# Patient Record
Sex: Female | Born: 1975 | Race: Black or African American | Hispanic: No | Marital: Single | Smoking: Never smoker
Health system: Southern US, Community
[De-identification: ages and names within clinical notes are randomized; demographics above are authoritative.]

## PROBLEM LIST (undated history)

## (undated) DIAGNOSIS — I10 Essential (primary) hypertension: Secondary | ICD-10-CM

---

## 2009-08-19 ENCOUNTER — Emergency Department (HOSPITAL_COMMUNITY): Admission: EM | Admit: 2009-08-19 | Discharge: 2009-08-19 | Payer: Self-pay | Admitting: Emergency Medicine

## 2009-09-07 ENCOUNTER — Emergency Department (HOSPITAL_COMMUNITY): Admission: EM | Admit: 2009-09-07 | Discharge: 2009-09-07 | Payer: Self-pay | Admitting: Emergency Medicine

## 2010-08-17 LAB — POCT I-STAT, CHEM 8
Calcium, Ion: 1.18 mmol/L (ref 1.12–1.32)
Chloride: 106 mEq/L (ref 96–112)
Creatinine, Ser: 0.8 mg/dL (ref 0.4–1.2)
HCT: 44 % (ref 36.0–46.0)
TCO2: 25 mmol/L (ref 0–100)

## 2010-08-17 LAB — URINALYSIS, ROUTINE W REFLEX MICROSCOPIC
Glucose, UA: NEGATIVE mg/dL
Ketones, ur: NEGATIVE mg/dL
Protein, ur: 30 mg/dL — AB
Specific Gravity, Urine: 1.024 (ref 1.005–1.030)
Urobilinogen, UA: 1 mg/dL (ref 0.0–1.0)

## 2010-08-17 LAB — URINE CULTURE

## 2010-08-17 LAB — URINE MICROSCOPIC-ADD ON

## 2013-08-14 ENCOUNTER — Emergency Department (HOSPITAL_COMMUNITY)
Admission: EM | Admit: 2013-08-14 | Discharge: 2013-08-15 | Disposition: A | Discharge status: Home / Self Care | Payer: Medicaid Other | Attending: Emergency Medicine | Admitting: Emergency Medicine

## 2013-08-14 ENCOUNTER — Encounter (HOSPITAL_COMMUNITY): Payer: Self-pay | Admitting: Emergency Medicine

## 2013-08-14 DIAGNOSIS — Z3202 Encounter for pregnancy test, result negative: Secondary | ICD-10-CM | POA: Insufficient documentation

## 2013-08-14 DIAGNOSIS — N390 Urinary tract infection, site not specified: Secondary | ICD-10-CM | POA: Insufficient documentation

## 2013-08-14 DIAGNOSIS — I10 Essential (primary) hypertension: Secondary | ICD-10-CM | POA: Insufficient documentation

## 2013-08-14 DIAGNOSIS — J069 Acute upper respiratory infection, unspecified: Secondary | ICD-10-CM

## 2013-08-14 DIAGNOSIS — Z79899 Other long term (current) drug therapy: Secondary | ICD-10-CM | POA: Insufficient documentation

## 2013-08-14 HISTORY — DX: Essential (primary) hypertension: I10

## 2013-08-14 LAB — I-STAT TROPONIN, ED: Troponin i, poc: 0 ng/mL (ref 0.00–0.08)

## 2013-08-14 LAB — COMPREHENSIVE METABOLIC PANEL
ALK PHOS: 53 U/L (ref 39–117)
ALT: 18 U/L (ref 0–35)
AST: 19 U/L (ref 0–37)
Albumin: 3.4 g/dL — ABNORMAL LOW (ref 3.5–5.2)
BILIRUBIN TOTAL: 0.6 mg/dL (ref 0.3–1.2)
BUN: 9 mg/dL (ref 6–23)
CHLORIDE: 97 meq/L (ref 96–112)
CO2: 26 meq/L (ref 19–32)
CREATININE: 1.02 mg/dL (ref 0.50–1.10)
Calcium: 9.6 mg/dL (ref 8.4–10.5)
GFR, EST AFRICAN AMERICAN: 81 mL/min — AB (ref >90)
GFR, EST NON AFRICAN AMERICAN: 70 mL/min — AB (ref >90)
GLUCOSE: 104 mg/dL — AB (ref 70–99)
POTASSIUM: 3.6 meq/L — AB (ref 3.7–5.3)
Sodium: 133 mEq/L — ABNORMAL LOW (ref 137–147)
Total Protein: 7.5 g/dL (ref 6.0–8.3)

## 2013-08-14 LAB — CBC WITH DIFFERENTIAL/PLATELET
BASOS PCT: 0 % (ref 0–1)
Basophils Absolute: 0 10*3/uL (ref 0.0–0.1)
EOS PCT: 1 % (ref 0–5)
Eosinophils Absolute: 0.1 10*3/uL (ref 0.0–0.7)
HCT: 43.4 % (ref 36.0–46.0)
HEMOGLOBIN: 15 g/dL (ref 12.0–15.0)
LYMPHS PCT: 11 % — AB (ref 12–46)
Lymphs Abs: 0.8 10*3/uL (ref 0.7–4.0)
MCH: 27 pg (ref 26.0–34.0)
MCHC: 34.6 g/dL (ref 30.0–36.0)
MCV: 78.1 fL (ref 78.0–100.0)
Monocytes Absolute: 0.2 10*3/uL (ref 0.1–1.0)
Monocytes Relative: 3 % (ref 3–12)
NEUTROS ABS: 6.3 10*3/uL (ref 1.7–7.7)
NEUTROS PCT: 86 % — AB (ref 43–77)
PLATELETS: 307 10*3/uL (ref 150–400)
RBC: 5.56 MIL/uL — AB (ref 3.87–5.11)
RDW: 14.1 % (ref 11.5–15.5)
WBC: 7.3 10*3/uL (ref 4.0–10.5)

## 2013-08-14 LAB — LIPASE, BLOOD: Lipase: 13 U/L (ref 11–59)

## 2013-08-14 NOTE — ED Notes (Signed)
Pt states fever and ABD pain for one week.  Pt states fever for 3 days.  Pt states she took some allergy medication that has helped with congestion.

## 2013-08-15 ENCOUNTER — Emergency Department (HOSPITAL_COMMUNITY): Payer: Medicaid Other

## 2013-08-15 LAB — PREGNANCY, URINE: Preg Test, Ur: NEGATIVE

## 2013-08-15 LAB — URINE MICROSCOPIC-ADD ON

## 2013-08-15 LAB — URINALYSIS, ROUTINE W REFLEX MICROSCOPIC
GLUCOSE, UA: NEGATIVE mg/dL
HGB URINE DIPSTICK: NEGATIVE
Ketones, ur: NEGATIVE mg/dL
NITRITE: NEGATIVE
PH: 5.5 (ref 5.0–8.0)
PROTEIN: NEGATIVE mg/dL
Specific Gravity, Urine: 1.026 (ref 1.005–1.030)
Urobilinogen, UA: 0.2 mg/dL (ref 0.0–1.0)

## 2013-08-15 LAB — I-STAT TROPONIN, ED: TROPONIN I, POC: 0 ng/mL (ref 0.00–0.08)

## 2013-08-15 MED ORDER — KETOROLAC TROMETHAMINE 30 MG/ML IJ SOLN
30.0000 mg | Freq: Once | INTRAMUSCULAR | Status: AC
  Administered 2013-08-15: 30 mg via INTRAVENOUS
  Filled 2013-08-15: qty 1

## 2013-08-15 MED ORDER — OXYMETAZOLINE HCL 0.05 % NA SOLN
1.0000 | Freq: Once | NASAL | Status: AC
  Administered 2013-08-15: 1 via NASAL
  Filled 2013-08-15: qty 15

## 2013-08-15 MED ORDER — DEXTROSE 5 % IV SOLN
1.0000 g | Freq: Once | INTRAVENOUS | Status: AC
  Administered 2013-08-15: 1 g via INTRAVENOUS
  Filled 2013-08-15: qty 10

## 2013-08-15 MED ORDER — POTASSIUM CHLORIDE CRYS ER 20 MEQ PO TBCR
40.0000 meq | EXTENDED_RELEASE_TABLET | Freq: Once | ORAL | Status: AC
  Administered 2013-08-15: 40 meq via ORAL
  Filled 2013-08-15: qty 2

## 2013-08-15 MED ORDER — IBUPROFEN 600 MG PO TABS: 600.0000 mg | ORAL_TABLET | Freq: Four times a day (QID) | ORAL | Status: DC | PRN

## 2013-08-15 MED ORDER — PANTOPRAZOLE SODIUM 40 MG IV SOLR
40.0000 mg | Freq: Once | INTRAVENOUS | Status: AC
  Administered 2013-08-15: 40 mg via INTRAVENOUS
  Filled 2013-08-15: qty 40

## 2013-08-15 MED ORDER — OXYMETAZOLINE HCL 0.05 % NA SOLN: 1.0000 | Freq: Two times a day (BID) | NASAL | Status: AC

## 2013-08-15 MED ORDER — SODIUM CHLORIDE 0.9 % IV BOLUS (SEPSIS)
1000.0000 mL | Freq: Once | INTRAVENOUS | Status: AC
  Administered 2013-08-15: 1000 mL via INTRAVENOUS

## 2013-08-15 MED ORDER — CEPHALEXIN 500 MG PO CAPS: 500.0000 mg | ORAL_CAPSULE | Freq: Four times a day (QID) | ORAL | Status: AC

## 2013-08-15 NOTE — ED Provider Notes (Signed)
CSN: [id number]     Arrival date & time 08/14/13  1954 History   First MD Initiated Contact with Patient 08/14/13 2334     Chief Complaint  Patient presents with  . Fever  . Abdominal Pain     (Consider location/radiation/quality/duration/timing/severity/associated sxs/prior Treatment) HPI History provided by patient. Feeling sick for the last 3 days with subjective fevers, congestion, dry cough and epigastric pain. Epigastric pain is mild in severity not radiating, sharp in quality. No abdominal pain otherwise. No chest pain or shortness of breath. Taking over-the-counter medications with minimal relief of symptoms. No known sick contacts. No recent travel. No rashes. Past Medical History  Diagnosis Date  . Hypertension    History reviewed. No pertinent past surgical history. No family history on file. History  Substance Use Topics  . Smoking status: Never Smoker   . Smokeless tobacco: Not on file  . Alcohol Use: No   OB History   Grav Para Term Preterm Abortions TAB SAB Ect Mult Living                 Review of Systems  Constitutional: Positive for fever.  Respiratory: Negative for shortness of breath.   Cardiovascular: Negative for chest pain.  Gastrointestinal: Positive for abdominal pain. Negative for vomiting and diarrhea.  Genitourinary: Positive for flank pain.  Musculoskeletal: Negative for back pain, neck pain and neck stiffness.  Skin: Negative for rash.  Neurological: Negative for weakness and headaches.  All other systems reviewed and are negative.      Allergies  Review of patient's allergies indicates no known allergies.  Home Medications   Current Outpatient Rx  Name  Route  Sig  Dispense  Refill  . acetaminophen (TYLENOL) 500 MG tablet   Oral   Take 500 mg by mouth every 6 (six) hours as needed for fever.         . diphenhydrAMINE (BENADRYL) 25 mg capsule   Oral   Take 25 mg by mouth every 6 (six) hours as needed for allergies.          . Fish Oil-Cholecalciferol (FISH OIL + D3 PO)   Oral   Take 2 capsules by mouth daily.         . hydrochlorothiazide (HYDRODIURIL) 25 MG tablet   Oral   Take 25 mg by mouth daily.         Marland Kitchen. labetalol (NORMODYNE) 100 MG tablet   Oral   Take 100 mg by mouth 2 (two) times daily.          BP 102/65  Pulse 81  Temp(Src) 98.1 F (36.7 C) (Oral)  Resp 16  Ht 5\' 7"  (1.702 m)  Wt 219 lb 8 oz (99.565 kg)  BMI 34.37 kg/m2  SpO2 100%  LMP 07/31/2013 Physical Exam  Constitutional: She is oriented to person, place, and time. She appears well-developed and well-nourished.  HENT:  Head: Normocephalic and atraumatic.  Eyes: EOM are normal. Pupils are equal, round, and reactive to light.  Neck: Neck supple.  Cardiovascular: Regular rhythm and intact distal pulses.   Tachycardia  Pulmonary/Chest: Effort normal and breath sounds normal. No respiratory distress. She exhibits no tenderness.  Abdominal: Soft. Bowel sounds are normal. She exhibits no distension.  Tender epigastric. negative Murphy sign without abdominal tenderness otherwise  Musculoskeletal: Normal range of motion. She exhibits no edema and no tenderness.  Neurological: She is alert and oriented to person, place, and time. No cranial nerve deficit.  Skin: Skin is warm  and dry.    ED Course  Procedures (including critical care time) Labs Review Labs Reviewed  CBC WITH DIFFERENTIAL - Abnormal; Notable for the following:    RBC 5.56 (*)    Neutrophils Relative % 86 (*)    Lymphocytes Relative 11 (*)    All other components within normal limits  COMPREHENSIVE METABOLIC PANEL - Abnormal; Notable for the following:    Sodium 133 (*)    Potassium 3.6 (*)    Glucose, Bld 104 (*)    Albumin 3.4 (*)    GFR calc non Af Amer 70 (*)    GFR calc Af Amer 81 (*)    All other components within normal limits  URINALYSIS, ROUTINE W REFLEX MICROSCOPIC - Abnormal; Notable for the following:    APPearance TURBID (*)    Bilirubin  Urine SMALL (*)    Leukocytes, UA MODERATE (*)    All other components within normal limits  URINE MICROSCOPIC-ADD ON - Abnormal; Notable for the following:    Squamous Epithelial / LPF MANY (*)    Bacteria, UA MANY (*)    All other components within normal limits  LIPASE, BLOOD  PREGNANCY, URINE  I-STAT TROPOININ, ED   Imaging Review US Abdomen Complete  08/15/2013   CLINICAL DATA:  Epigastric pain.  EXAM: ULTRASOUND ABDOMEN COMPLETE  COMPARISON:  None.  FINDINGS: Gallbladder:  No gallstones or wall thickening visualized. No sonographic Murphy sign noted.  Common bile duct:  Diameter: 0.5 cm.  Liver:  No focal lesion identified. Within normal limits in parenchymal echogenicity.  IVC:  No abnormality visualized.  Pancreas:  Visualized portion unremarkable.  Spleen:  Size and appearance within normal limits.  Right Kidney:  Length: 11.3 cm. Echogenicity within normal limits. No mass or hydronephrosis visualized.  Left Kidney:  Length: 11.8 cm. Echogenicity within normal limits. No mass or hydronephrosis visualized.  Abdominal aorta:  No aneurysm visualized.  Other findings:  None.  IMPRESSION: Negative for gallstones.  Negative exam.   Electronically Signed   By: Drusilla Kanner M.D.   On: 08/15/2013 03:37     EKG Interpretation   Date/Time:  Friday August 15 2013 00:01:55 EDT Ventricular Rate:  84 PR Interval:  175 QRS Duration: 86 QT Interval:  368 QTC Calculation: 435 R Axis:   84 Text Interpretation:  Sinus rhythm Nonspecific ST abnormality Confirmed by  Torrion Witter  MD, Truth Barot ([id number]) on 08/15/2013 4:58:34 AM     IV fluids. IV Toradol. Protonix. Potassium provided for hypokalemia Rocephin provided for UTI  5:01 AM on recheck is resting comfortably with pain resolved. No longer tachycardic. Now complaining of some mild headache otherwise feels comfortable to be discharged home. Plan followup with primary care physician. Prescription for Afrin, Keflex, Motrin provided.  MDM    Diagnosis: UTI, URI  Evaluated with labs, EKG and urinalysis reviewed as above. Ultrasound ordered to evaluate gallbladder which is normal. Improved with IV fluids and medications. Vital signs and nursing notes reviewed and considered.  Sunnie Nielsen, MD 08/15/13 [phone]

## 2013-08-15 NOTE — Discharge Instructions (Signed)

## 2013-08-15 NOTE — ED Notes (Signed)
Ultrasound tech stated that he has not been able to get back to the unit to get the ultrasounds transmitted.

## 2013-08-19 ENCOUNTER — Encounter (HOSPITAL_COMMUNITY): Payer: Self-pay | Admitting: Emergency Medicine

## 2013-08-19 ENCOUNTER — Emergency Department (HOSPITAL_COMMUNITY)
Admission: EM | Admit: 2013-08-19 | Discharge: 2013-08-19 | Disposition: A | Discharge status: Home / Self Care | Payer: Medicaid Other | Attending: Emergency Medicine | Admitting: Emergency Medicine

## 2013-08-19 DIAGNOSIS — Z792 Long term (current) use of antibiotics: Secondary | ICD-10-CM | POA: Insufficient documentation

## 2013-08-19 DIAGNOSIS — I10 Essential (primary) hypertension: Secondary | ICD-10-CM | POA: Insufficient documentation

## 2013-08-19 DIAGNOSIS — T50905A Adverse effect of unspecified drugs, medicaments and biological substances, initial encounter: Secondary | ICD-10-CM

## 2013-08-19 DIAGNOSIS — Z87440 Personal history of urinary (tract) infections: Secondary | ICD-10-CM | POA: Insufficient documentation

## 2013-08-19 DIAGNOSIS — T3695XA Adverse effect of unspecified systemic antibiotic, initial encounter: Secondary | ICD-10-CM | POA: Insufficient documentation

## 2013-08-19 DIAGNOSIS — M7989 Other specified soft tissue disorders: Secondary | ICD-10-CM | POA: Insufficient documentation

## 2013-08-19 DIAGNOSIS — M79609 Pain in unspecified limb: Secondary | ICD-10-CM | POA: Insufficient documentation

## 2013-08-19 DIAGNOSIS — Z79899 Other long term (current) drug therapy: Secondary | ICD-10-CM | POA: Insufficient documentation

## 2013-08-19 DIAGNOSIS — Z8709 Personal history of other diseases of the respiratory system: Secondary | ICD-10-CM | POA: Insufficient documentation

## 2013-08-19 LAB — URINE MICROSCOPIC-ADD ON

## 2013-08-19 LAB — URINALYSIS, ROUTINE W REFLEX MICROSCOPIC
Bilirubin Urine: NEGATIVE
Glucose, UA: NEGATIVE mg/dL
Hgb urine dipstick: NEGATIVE
Ketones, ur: NEGATIVE mg/dL
NITRITE: NEGATIVE
PROTEIN: NEGATIVE mg/dL
Specific Gravity, Urine: 1.02 (ref 1.005–1.030)
UROBILINOGEN UA: 1 mg/dL (ref 0.0–1.0)
pH: 7 (ref 5.0–8.0)

## 2013-08-19 MED ORDER — DIPHENHYDRAMINE HCL 25 MG PO CAPS
25.0000 mg | ORAL_CAPSULE | Freq: Once | ORAL | Status: AC
  Administered 2013-08-19: 25 mg via ORAL
  Filled 2013-08-19: qty 1

## 2013-08-19 MED ORDER — IBUPROFEN 800 MG PO TABS: 800.0000 mg | ORAL_TABLET | Freq: Three times a day (TID) | ORAL | Status: DC

## 2013-08-19 MED ORDER — KETOROLAC TROMETHAMINE 60 MG/2ML IM SOLN
60.0000 mg | Freq: Once | INTRAMUSCULAR | Status: AC
  Administered 2013-08-19: 60 mg via INTRAMUSCULAR
  Filled 2013-08-19: qty 2

## 2013-08-19 NOTE — ED Notes (Signed)
Pt reports burning pain in both legs and both hands. Reports joint pain and swelling. Denies fever.

## 2013-08-19 NOTE — ED Notes (Signed)
Nasal congestion symptoms has improved.  Pt was not having any urinary symptoms at time of ED visit.

## 2013-08-19 NOTE — ED Notes (Signed)
Pts sister husband brought pt to ED and will pick up pt.

## 2013-08-19 NOTE — Discharge Instructions (Signed)
Take ibuprofen as directed as needed for pain. Continue taking Benadryl as needed for itching. Stopped taking Keflex, the antibiotic given for urinary tract infection. Return to the emergency department with any worsening symptoms, including weakness, difficulty breathing or weakness in your face.  Musculoskeletal Pain Musculoskeletal pain is muscle and boney aches and pains. These pains can occur in any part of the body. Your caregiver may treat you without knowing the cause of the pain. They may treat you if blood or urine tests, X-rays, and other tests were normal.  CAUSES There is often not a definite cause or reason for these pains. These pains may be caused by a type of germ (virus). The discomfort may also come from overuse. Overuse includes working out too hard when your body is not fit. Boney aches also come from weather changes. Bone is sensitive to atmospheric pressure changes. HOME CARE INSTRUCTIONS   Ask when your test results will be ready. Make sure you get your test results.  Only take over-the-counter or prescription medicines for pain, discomfort, or fever as directed by your caregiver. If you were given medications for your condition, do not drive, operate machinery or power tools, or sign legal documents for 24 hours. Do not drink alcohol. Do not take sleeping pills or other medications that may interfere with treatment.  Continue all activities unless the activities cause more pain. When the pain lessens, slowly resume normal activities. Gradually increase the intensity and duration of the activities or exercise.  During periods of severe pain, bed rest may be helpful. Lay or sit in any position that is comfortable.  Putting ice on the injured area.  Put ice in a bag.  Place a towel between your skin and the bag.  Leave the ice on for 15 to 20 minutes, 3 to 4 times a day.  Follow up with your caregiver for continued problems and no reason can be found for the pain. If the  pain becomes worse or does not go away, it may be necessary to repeat tests or do additional testing. Your caregiver may need to look further for a possible cause. SEEK IMMEDIATE MEDICAL CARE IF:  You have pain that is getting worse and is not relieved by medications.  You develop chest pain that is associated with shortness or breath, sweating, feeling sick to your stomach (nauseous), or throw up (vomit).  Your pain becomes localized to the abdomen.  You develop any new symptoms that seem different or that concern you. MAKE SURE YOU:   Understand these instructions.  Will watch your condition.  Will get help right away if you are not doing well or get worse. Document Released: 05/15/2005 Document Revised: 08/07/2011 Document Reviewed: 01/17/2013 [facility] Patient Information 2014 [place], [place].  Drug Allergy Allergic reactions to medicines are common. Some allergic reactions are mild. A delayed type of drug allergy that occurs 1 week or more after exposure to a medicine or vaccine is called serum sickness. A life-threatening, sudden (acute) allergic reaction that involves the whole body is called anaphylaxis. CAUSES  "True" drug allergies occur when there is an allergic reaction to a medicine. This is caused by overactivity of the immune system. First, the body becomes sensitized. The immune system is triggered by your first exposure to the medicine. Following this first exposure, future exposure to the same medicine may be life-threatening. Almost any medicine can cause an allergic reaction. Common ones are:  Penicillin.  Sulfonamides (sulfa drugs).  Local anesthetics.  X-ray dyes  that contain iodine. SYMPTOMS  Common symptoms of a minor allergic reaction are:  Swelling around the mouth.  An itchy red rash or hives.  Vomiting or diarrhea. Anaphylaxis can cause swelling of the mouth and throat. This makes it difficult to breathe and swallow. Severe reactions can be fatal  within seconds, even after exposure to only a trace amount of the drug that causes the reaction. HOME CARE INSTRUCTIONS   If you are unsure of what caused your reaction, keep a diary of foods and medicines used. Include the symptoms that followed. Avoid anything that causes reactions.  You may want to follow up with an allergy specialist after the reaction has cleared in order to be tested to confirm the allergy. It is important to confirm that your reaction is an allergy, not just a side effect to the medicine. If you have a true allergy to a medicine, this may prevent that medicine and related medicines from being given to you when you are very ill.  If you have hives or a rash:  Take medicines as directed by your caregiver.  You may use an over-the-counter antihistamine (diphenhydramine) as needed.  Apply cold compresses to the skin or take baths in cool water. Avoid hot baths or showers.  If you are severely allergic:  Continuous observation after a severe reaction may be needed. Hospitalization is often required.  Wear a medical alert bracelet or necklace stating your allergy.  You and your family must learn how to use an anaphylaxis kit or give an epinephrine injection to temporarily treat an emergency allergic reaction. If you have had a severe reaction, always carry your epinephrine injection or anaphylaxis kit with you. This can be lifesaving if you have a severe reaction.  Do not drive or perform tasks after treatment until the medicines used to treat your reaction have worn off, or until your caregiver says it is okay. SEEK MEDICAL CARE IF:   You think you had an allergic reaction. Symptoms usually start within 30 minutes after exposure.  Symptoms are getting worse rather than better.  You develop new symptoms.  The symptoms that brought you to your caregiver return. SEEK IMMEDIATE MEDICAL CARE IF:   You have swelling of the mouth, difficulty breathing, or  wheezing.  You have a tight feeling in your chest or throat.  You develop hives, swelling, or itching all over your body.  You develop severe vomiting or diarrhea.  You feel faint or pass out. This is an emergency. Use your epinephrine injection or anaphylaxis kit as you have been instructed. Call for emergency medical help. Even if you improve after the injection, you need to be examined at a hospital emergency department. MAKE SURE YOU:   Understand these instructions.  Will watch your condition.  Will get help right away if you are not doing well or get worse. Document Released: 05/15/2005 Document Revised: 08/07/2011 Document Reviewed: 10/19/2010 [facility] Patient Information 2014 [place], [place].

## 2013-08-19 NOTE — ED Provider Notes (Signed)
Medical screening examination/treatment/procedure(s) were performed by non-physician practitioner and as supervising physician I was immediately available for consultation/collaboration.   EKG Interpretation None        Shelda JakesScott W. Delvin Hedeen, MD 08/19/13 1351

## 2013-08-19 NOTE — ED Provider Notes (Signed)
CSN: [id number]     Arrival date & time 08/19/13  1023 History   First MD Initiated Contact with Patient 08/19/13 1111     Chief Complaint  Patient presents with  . Extremity Pain     (Consider location/radiation/quality/duration/timing/severity/associated sxs/prior Treatment) HPI Comments: Patient is a 38 year old female with past medical history of hypertension who presents to the emergency department complaining of pain to bilateral hands and feet x3 days. Patient states she was seen in the emergency Department 4 days ago for a URI and UTI, prescribed Keflex. She began taking Keflex the next day, at the same time she developed bilateral hand and foot pain described as  burning with a tingling sensation radiating up her legs. Denies weakness or numbness. Pain worse with walking. States her hands and feet feel swollen and aren't itchy, itching relieved by Benadryl. No rash. She has not completed the course of Keflex. States her symptoms of URI have resolved. Denies fever, chills, respiratory difficulty. Denies any prior reactions to medications or allergies that she is aware of.  The history is provided by the patient.    Past Medical History  Diagnosis Date  . Hypertension    History reviewed. No pertinent past surgical history. History reviewed. No pertinent family history. History  Substance Use Topics  . Smoking status: Never Smoker   . Smokeless tobacco: Not on file  . Alcohol Use: No   OB History   Grav Para Term Preterm Abortions TAB SAB Ect Mult Living                 Review of Systems  Musculoskeletal:       Positive for bilateral hand and feet pain.  Skin:       Positive for itching.  All other systems reviewed and are negative.      Allergies  Review of patient's allergies indicates no known allergies.  Home Medications   Current Outpatient Rx  Name  Route  Sig  Dispense  Refill  . acetaminophen (TYLENOL) 650 MG CR tablet   Oral   Take 650 mg by mouth  every 8 (eight) hours as needed for pain.         Marland Kitchen albuterol (PROVENTIL HFA) 108 (90 BASE) MCG/ACT inhaler   Inhalation   Inhale 1 puff into the lungs every 6 (six) hours as needed for wheezing or shortness of breath.         . cephALEXin (KEFLEX) 500 MG capsule   Oral   Take 1 capsule (500 mg total) by mouth 4 (four) times daily.   40 capsule   0   . diphenhydrAMINE (BENADRYL) 25 mg capsule   Oral   Take 25 mg by mouth every 6 (six) hours as needed for allergies.         . Fish Oil-Cholecalciferol (FISH OIL + D3 PO)   Oral   Take 2 capsules by mouth daily.         . hydrochlorothiazide (HYDRODIURIL) 25 MG tablet   Oral   Take 25 mg by mouth daily.         Marland Kitchen ibuprofen (ADVIL,MOTRIN) 600 MG tablet   Oral   Take 600 mg by mouth every 6 (six) hours as needed for moderate pain.         Marland Kitchen labetalol (NORMODYNE) 100 MG tablet   Oral   Take 100 mg by mouth 2 (two) times daily.         Marland Kitchen oxymetazoline (AFRIN NASAL SPRAY)  0.05 % nasal spray   Each Nare   Place 1 spray into both nostrils 2 (two) times daily.   30 mL   0   . ibuprofen (ADVIL,MOTRIN) 800 MG tablet   Oral   Take 1 tablet (800 mg total) by mouth 3 (three) times daily.   21 tablet   0    BP 122/73  Pulse 82  Temp(Src) 98.3 F (36.8 C) (Oral)  Resp 18  Wt 219 lb (99.338 kg)  SpO2 100%  LMP 07/31/2013 Physical Exam  Nursing note and vitals reviewed. Constitutional: She is oriented to person, place, and time. She appears well-developed and well-nourished. No distress.  HENT:  Head: Normocephalic and atraumatic.  Mouth/Throat: Oropharynx is clear and moist.  Eyes: Conjunctivae are normal.  Neck: Normal range of motion. Neck supple.  Cardiovascular: Normal rate, regular rhythm and normal heart sounds.   Pulmonary/Chest: Effort normal and breath sounds normal.  Abdominal: Soft. Bowel sounds are normal. There is no tenderness.  Musculoskeletal: Normal range of motion. She exhibits no edema.   Generalized tenderness to palpation of bilateral hands and feet without swelling. Full range of motion.  Neurological: She is alert and oriented to person, place, and time.  Strength upper and lower extremities 5/5 and equal bilateral. Sensation intact.  Skin: Skin is warm and dry. She is not diaphoretic.  Psychiatric: She has a normal mood and affect. Her behavior is normal.    ED Course  Procedures (including critical care time) Labs Review Labs Reviewed  URINALYSIS, ROUTINE W REFLEX MICROSCOPIC - Abnormal; Notable for the following:    APPearance HAZY (*)    Leukocytes, UA LARGE (*)    All other components within normal limits  URINE MICROSCOPIC-ADD ON - Abnormal; Notable for the following:    Squamous Epithelial / LPF MANY (*)    Bacteria, UA FEW (*)    All other components within normal limits  URINE CULTURE   Imaging Review No results found.   EKG Interpretation None      MDM   Final diagnoses:  Medication reaction  Extremity pain   Patient presenting with bilateral hand pain, itching after beginning taking Keflex. She is well appearing and in no apparent distress, afebrile with normal vital signs. No prior reactions to medications that she is aware of. No rash. No focal weakness. Unremarkable neurologic exam. No respiratory difficulty. Plan to treat pain with Toradol, Benadryl and recheck urine, discontinue Keflex. Will discuss symptoms to watch for for Guillain-Barre syndrome. Case discussed with attending Dr. Deretha EmoryZackowski who agrees with plan of care. 1:37 PM Patient states her pain has started to improve with Toradol and Benadryl. I discussed discontinuing Keflex, patient denies any urinary symptoms. Urine culture sent. Urine with 7-10 white blood cells, and many leukocytes, few bacteria, however many squamous epithelial cells. Will discharge home with ibuprofen, advised her to continue taking Benadryl as needed. Close return precautions discussed including extremity  weakness, respiratory difficulty or facial changes. Stable for discharge. Patient states understanding of plan and is agreeable.   Trevor MaceRobyn M Albert, PA-C 08/19/13 1339

## 2013-08-19 NOTE — ED Notes (Signed)
Send req down to lab to add on urine culture.

## 2013-08-19 NOTE — ED Notes (Signed)
Seen in ED 08-14-13 for URI and UTI.  When left ED pt started itching all over body and having pain/swelling to bilateral arms and legs.  This started prior to starting Keflex at home.  Last dose of Keflex last night.  Took Benadryl yesterday, with some relief in itching.  No respiratory difficulties.

## 2013-08-20 LAB — URINE CULTURE
COLONY COUNT: NO GROWTH
Culture: NO GROWTH

## 2014-05-11 ENCOUNTER — Encounter (HOSPITAL_COMMUNITY): Payer: Self-pay | Admitting: Emergency Medicine

## 2014-05-11 DIAGNOSIS — Z791 Long term (current) use of non-steroidal anti-inflammatories (NSAID): Secondary | ICD-10-CM | POA: Insufficient documentation

## 2014-05-11 DIAGNOSIS — R51 Headache: Secondary | ICD-10-CM | POA: Diagnosis not present

## 2014-05-11 DIAGNOSIS — Z79899 Other long term (current) drug therapy: Secondary | ICD-10-CM | POA: Diagnosis not present

## 2014-05-11 DIAGNOSIS — I10 Essential (primary) hypertension: Secondary | ICD-10-CM | POA: Insufficient documentation

## 2014-05-11 LAB — I-STAT CHEM 8, ED
BUN: 8 mg/dL (ref 6–23)
CREATININE: 0.9 mg/dL (ref 0.50–1.10)
Calcium, Ion: 1.21 mmol/L (ref 1.12–1.23)
Chloride: 105 mEq/L (ref 96–112)
Glucose, Bld: 86 mg/dL (ref 70–99)
HEMATOCRIT: 44 % (ref 36.0–46.0)
Hemoglobin: 15 g/dL (ref 12.0–15.0)
POTASSIUM: 4 meq/L (ref 3.7–5.3)
SODIUM: 139 meq/L (ref 137–147)
TCO2: 21 mmol/L (ref 0–100)

## 2014-05-11 LAB — CBC
HCT: 40 % (ref 36.0–46.0)
HEMOGLOBIN: 13.1 g/dL (ref 12.0–15.0)
MCH: 25.4 pg — ABNORMAL LOW (ref 26.0–34.0)
MCHC: 32.8 g/dL (ref 30.0–36.0)
MCV: 77.7 fL — ABNORMAL LOW (ref 78.0–100.0)
Platelets: 287 10*3/uL (ref 150–400)
RBC: 5.15 MIL/uL — AB (ref 3.87–5.11)
RDW: 14.8 % (ref 11.5–15.5)
WBC: 6.1 10*3/uL (ref 4.0–10.5)

## 2014-05-11 NOTE — ED Notes (Signed)
Patient states she has been out of her HTN meds for one week and is not able to get an appointment with her PCP.  Patient now with headache and dizziness.

## 2014-05-12 ENCOUNTER — Emergency Department (HOSPITAL_COMMUNITY)
Admission: EM | Admit: 2014-05-12 | Discharge: 2014-05-12 | Disposition: A | Discharge status: Home / Self Care | Payer: Medicaid Other | Attending: Emergency Medicine | Admitting: Emergency Medicine

## 2014-05-12 DIAGNOSIS — I10 Essential (primary) hypertension: Secondary | ICD-10-CM

## 2014-05-12 DIAGNOSIS — R519 Headache, unspecified: Secondary | ICD-10-CM

## 2014-05-12 DIAGNOSIS — R51 Headache: Secondary | ICD-10-CM

## 2014-05-12 MED ORDER — OXYCODONE-ACETAMINOPHEN 5-325 MG PO TABS
1.0000 | ORAL_TABLET | Freq: Once | ORAL | Status: AC
  Administered 2014-05-12: 1 via ORAL
  Filled 2014-05-12: qty 1

## 2014-05-12 MED ORDER — HYDROCHLOROTHIAZIDE 25 MG PO TABS: 25.0000 mg | ORAL_TABLET | Freq: Every day | ORAL | Status: AC

## 2014-05-12 MED ORDER — LABETALOL HCL 100 MG PO TABS: 100.0000 mg | ORAL_TABLET | Freq: Two times a day (BID) | ORAL | Status: AC

## 2014-05-12 MED ORDER — ALBUTEROL SULFATE HFA 108 (90 BASE) MCG/ACT IN AERS
2.0000 | INHALATION_SPRAY | RESPIRATORY_TRACT | Status: DC | PRN
  Administered 2014-05-12: 2 via RESPIRATORY_TRACT
  Filled 2014-05-12: qty 6.7

## 2014-05-12 NOTE — Discharge Instructions (Signed)
Potassium Content of Foods Potassium is a mineral found in many foods and drinks. It helps keep fluids and minerals balanced in your body and affects how steadily your heart beats. Potassium also helps control your blood pressure and keep your muscles and nervous system healthy. Certain health conditions and medicines may change the balance of potassium in your body. When this happens, you can help balance your level of potassium through the foods that you do or do not eat. Your health care provider or dietitian may recommend an amount of potassium that you should have each day. The following lists of foods provide the amount of potassium (in parentheses) per serving in each item. HIGH IN POTASSIUM  The following foods and beverages have 200 mg or more of potassium per serving:  Apricots, 2 raw or 5 dry (200 mg).  Artichoke, 1 medium (345 mg).  Avocado, raw,  each (245 mg).  Banana, 1 medium (425 mg).  Beans, lima, or baked beans, canned,  cup (280 mg).  Beans, white, canned,  cup (595 mg).  Beef roast, 3 oz (320 mg).  Beef, ground, 3 oz (270 mg).  Beets, raw or cooked,  cup (260 mg).  Bran muffin, 2 oz (300 mg).  Broccoli,  cup (230 mg).  Brussels sprouts,  cup (250 mg).  Cantaloupe,  cup (215 mg).  Cereal, 100% bran,  cup (200-400 mg).  Cheeseburger, single, fast food, 1 each (225-400 mg).  Chicken, 3 oz (220 mg).  Clams, canned, 3 oz (535 mg).  Crab, 3 oz (225 mg).  Dates, 5 each (270 mg).  Dried beans and peas,  cup (300-475 mg).  Figs, dried, 2 each (260 mg).  Fish: halibut, tuna, cod, snapper, 3 oz (480 mg).  Fish: salmon, haddock, swordfish, perch, 3 oz (300 mg).  Fish, tuna, canned 3 oz (200 mg).  [place] fries, fast food, 3 oz (470 mg).  Granola with fruit and nuts,  cup (200 mg).  Grapefruit juice,  cup (200 mg).  Greens, beet,  cup (655 mg).  Honeydew melon,  cup (200 mg).  Kale, raw, 1 cup (300 mg).  Kiwi, 1 medium (240  mg).  Kohlrabi, rutabaga, parsnips,  cup (280 mg).  Lentils,  cup (365 mg).  Mango, 1 each (325 mg).  Milk, chocolate, 1 cup (420 mg).  Milk: nonfat, low-fat, whole, buttermilk, 1 cup (350-380 mg).  Molasses, 1 Tbsp (295 mg).  Mushrooms,  cup (280) mg.  Nectarine, 1 each (275 mg).  Nuts: almonds, peanuts, hazelnuts, [place], cashew, mixed, 1 oz (200 mg).  Nuts, pistachios, 1 oz (295 mg).  Orange, 1 each (240 mg).  Orange juice,  cup (235 mg).  Papaya, medium,  fruit (390 mg).  Peanut butter, chunky, 2 Tbsp (240 mg).  Peanut butter, smooth, 2 Tbsp (210 mg).  Pear, 1 medium (200 mg).  Pomegranate, 1 whole (400 mg).  Pomegranate juice,  cup (215 mg).  Pork, 3 oz (350 mg).  Potato chips, salted, 1 oz (465 mg).  Potato, baked with skin, 1 medium (925 mg).  Potatoes, boiled,  cup (255 mg).  Potatoes, mashed,  cup (330 mg).  Prune juice,  cup (370 mg).  Prunes, 5 each (305 mg).  Pudding, chocolate,  cup (230 mg).  Pumpkin, canned,  cup (250 mg).  Raisins, seedless,  cup (270 mg).  Seeds, sunflower or pumpkin, 1 oz (240 mg).  Soy milk, 1 cup (300 mg).  Spinach,  cup (420 mg).  Spinach, canned,  cup (370 mg).  Sweet potato, baked with skin, 1 medium (450 mg).  Swiss chard,  cup (480 mg).  Tomato or vegetable juice,  cup (275 mg).  Tomato sauce or puree,  cup (400-550 mg).  Tomato, raw, 1 medium (290 mg).  Tomatoes, canned,  cup (200-300 mg).  [place], 3 oz (250 mg).  Wheat germ, 1 oz (250 mg).  Winter squash,  cup (250 mg).  Yogurt, plain or fruited, 6 oz (260-435 mg).  Zucchini,  cup (220 mg). MODERATE IN POTASSIUM The following foods and beverages have 50-200 mg of potassium per serving:  Apple, 1 each (150 mg).  Apple juice,  cup (150 mg).  Applesauce,  cup (90 mg).  Apricot nectar,  cup (140 mg).  Asparagus, small spears,  cup or 6 spears (155 mg).  Bagel, cinnamon raisin, 1 each (130 mg).  Bagel,  egg or plain, 4 in., 1 each (70 mg).  Beans, green,  cup (90 mg).  Beans, yellow,  cup (190 mg).  Beer, regular, 12 oz (100 mg).  Beets, canned,  cup (125 mg).  Blackberries,  cup (115 mg).  Blueberries,  cup (60 mg).  Bread, whole wheat, 1 slice (70 mg).  Broccoli, raw,  cup (145 mg).  Cabbage,  cup (150 mg).  Carrots, cooked or raw,  cup (180 mg).  Cauliflower, raw,  cup (150 mg).  Celery, raw,  cup (155 mg).  Cereal, bran flakes, cup (120-150 mg).  Cheese, cottage,  cup (110 mg).  Cherries, 10 each (150 mg).  Chocolate, 1 oz bar (165 mg).  Coffee, brewed 6 oz (90 mg).  Corn,  cup or 1 ear (195 mg).  Cucumbers,  cup (80 mg).  Egg, large, 1 each (60 mg).  Eggplant,  cup (60 mg).  Endive, raw, cup (80 mg).  English muffin, 1 each (65 mg).  Fish, orange roughy, 3 oz (150 mg).  Frankfurter, beef or pork, 1 each (75 mg).  Fruit cocktail,  cup (115 mg).  Grape juice,  cup (170 mg).  Grapefruit,  fruit (175 mg).  Grapes,  cup (155 mg).  Greens: kale, turnip, collard,  cup (110-150 mg).  Ice cream or frozen yogurt, chocolate,  cup (175 mg).  Ice cream or frozen yogurt, vanilla,  cup (120-150 mg).  Lemons, limes, 1 each (80 mg).  Lettuce, all types, 1 cup (100 mg).  Mixed vegetables,  cup (150 mg).  Mushrooms, raw,  cup (110 mg).  Nuts: walnuts, pecans, or macadamia, 1 oz (125 mg).  Oatmeal,  cup (80 mg).  Okra,  cup (110 mg).  Onions, raw,  cup (120 mg).  Peach, 1 each (185 mg).  Peaches, canned,  cup (120 mg).  Pears, canned,  cup (120 mg).  Peas, green, frozen,  cup (90 mg).  Peppers, green,  cup (130 mg).  Peppers, red,  cup (160 mg).  Pineapple juice,  cup (165 mg).  Pineapple, fresh or canned,  cup (100 mg).  Plums, 1 each (105 mg).  Pudding, vanilla,  cup (150 mg).  Raspberries,  cup (90 mg).  Rhubarb,  cup (115 mg).  Rice, wild,  cup (80 mg).  Shrimp, 3 oz (155  mg).  Spinach, raw, 1 cup (170 mg).  Strawberries,  cup (125 mg).  Summer squash  cup (175-200 mg).  Swiss chard, raw, 1 cup (135 mg).  Tangerines, 1 each (140 mg).  Tea, brewed, 6 oz (65 mg).  Turnips,  cup (140 mg).  Watermelon,  cup (85 mg).  Wine, red, table,  5 oz (180 mg).  Wine, white, table, 5 oz (100 mg). LOW IN POTASSIUM The following foods and beverages have less than 50 mg of potassium per serving.  Bread, white, 1 slice (30 mg).  Carbonated beverages, 12 oz (less than 5 mg).  Cheese, 1 oz (20-30 mg).  Cranberries,  cup (45 mg).  Cranberry juice cocktail,  cup (20 mg).  Fats and oils, 1 Tbsp (less than 5 mg).  Hummus, 1 Tbsp (32 mg).  Nectar: papaya, mango, or pear,  cup (35 mg).  Rice, white or brown,  cup (50 mg).  Spaghetti or macaroni,  cup cooked (30 mg).  Tortilla, flour or corn, 1 each (50 mg).  Waffle, 4 in., 1 each (50 mg).  Water chestnuts,  cup (40 mg). Document Released: 12/27/2004 Document Revised: 05/20/2013 Document Reviewed: 04/11/2013 [facility] Patient Information 2015 [employer], [place]. This information is not intended to replace advice given to you by your health care provider. Make sure you discuss any questions you have with your health care provider. Hypertension Hypertension, commonly called high blood pressure, is when the force of blood pumping through your arteries is too strong. Your arteries are the blood vessels that carry blood from your heart throughout your body. A blood pressure reading consists of a higher number over a lower number, such as 110/72. The higher number (systolic) is the pressure inside your arteries when your heart pumps. The lower number (diastolic) is the pressure inside your arteries when your heart relaxes. Ideally you want your blood pressure below 120/80. Hypertension forces your heart to work harder to pump blood. Your arteries may become narrow or stiff. Having hypertension puts you at risk  for heart disease, stroke, and other problems.  RISK FACTORS Some risk factors for high blood pressure are controllable. Others are not.  Risk factors you cannot control include:   Race. You may be at higher risk if you are African American.  Age. Risk increases with age.  Gender. Men are at higher risk than women before age 37 years. After age 37, women are at higher risk than men. Risk factors you can control include:  Not getting enough exercise or physical activity.  Being overweight.  Getting too much fat, sugar, calories, or salt in your diet.  Drinking too much alcohol. SIGNS AND SYMPTOMS Hypertension does not usually cause signs or symptoms. Extremely high blood pressure (hypertensive crisis) may cause headache, anxiety, shortness of breath, and nosebleed. DIAGNOSIS  To check if you have hypertension, your health care provider will measure your blood pressure while you are seated, with your arm held at the level of your heart. It should be measured at least twice using the same arm. Certain conditions can cause a difference in blood pressure between your right and left arms. A blood pressure reading that is higher than normal on one occasion does not mean that you need treatment. If one blood pressure reading is high, ask your health care provider about having it checked again. TREATMENT  Treating high blood pressure includes making lifestyle changes and possibly taking medicine. Living a healthy lifestyle can help lower high blood pressure. You may need to change some of your habits. Lifestyle changes may include:  Following the DASH diet. This diet is high in fruits, vegetables, and whole grains. It is low in salt, red meat, and added sugars.  Getting at least 2 hours of brisk physical activity every week.  Losing weight if necessary.  Not smoking.  Limiting alcoholic beverages.  Learning ways to reduce stress. If lifestyle changes are not enough to get your blood  pressure under control, your health care provider may prescribe medicine. You may need to take more than one. Work closely with your health care provider to understand the risks and benefits. HOME CARE INSTRUCTIONS  Have your blood pressure rechecked as directed by your health care provider.   Take medicines only as directed by your health care provider. Follow the directions carefully. Blood pressure medicines must be taken as prescribed. The medicine does not work as well when you skip doses. Skipping doses also puts you at risk for problems.   Do not smoke.   Monitor your blood pressure at home as directed by your health care provider. SEEK MEDICAL CARE IF:   You think you are having a reaction to medicines taken.  You have recurrent headaches or feel dizzy.  You have swelling in your ankles.  You have trouble with your vision. SEEK IMMEDIATE MEDICAL CARE IF:  You develop a severe headache or confusion.  You have unusual weakness, numbness, or feel faint.  You have severe chest or abdominal pain.  You vomit repeatedly.  You have trouble breathing. MAKE SURE YOU:   Understand these instructions.  Will watch your condition.  Will get help right away if you are not doing well or get worse. Document Released: 05/15/2005 Document Revised: 09/29/2013 Document Reviewed: 03/07/2013 [facility]Exit[facility] Patient Information 2015 [place]ExitCare, [place]LL[place]. This information is not intended to replace advice given to you by your health care provider. Make sure you discuss any questions you have with your health care provider.

## 2014-05-15 NOTE — ED Provider Notes (Signed)
CSN: [id number]     Arrival date & time 05/11/14  2059 History   First MD Initiated Contact with Patient 05/12/14 0143     Chief Complaint  Patient presents with  . Hypertension  . Headache     (Consider location/radiation/quality/duration/timing/severity/associated sxs/prior Treatment) Patient is a 38 y.o. female presenting with hypertension and headaches. The history is provided by the patient. No language interpreter was used.  Hypertension This is a chronic problem. Associated symptoms include headaches. Pertinent negatives include no chest pain, chills, fever or weakness. Associated symptoms comments: She presents with concern for headache and dizziness after being out of her medications for blood pressure for the past week. No vomiting, fever, syncope, CP, SOB. Marland Kitchen.  Headache Associated symptoms: no fever     Past Medical History  Diagnosis Date  . Hypertension    History reviewed. No pertinent past surgical history. No family history on file. History  Substance Use Topics  . Smoking status: Never Smoker   . Smokeless tobacco: Not on file  . Alcohol Use: No   OB History    No data available     Review of Systems  Constitutional: Negative for fever and chills.  Eyes: Negative for visual disturbance.  Respiratory: Negative.  Negative for shortness of breath.   Cardiovascular: Negative.  Negative for chest pain.  Gastrointestinal: Negative.   Musculoskeletal: Negative.   Skin: Negative.   Neurological: Positive for light-headedness and headaches. Negative for syncope and weakness.      Allergies  Review of patient's allergies indicates no known allergies.  Home Medications   Prior to Admission medications   Medication Sig Start Date End Date Taking? Authorizing Provider  acetaminophen (TYLENOL) 650 MG CR tablet Take 650 mg by mouth every 8 (eight) hours as needed for pain.   Yes Historical Provider, MD  albuterol (PROVENTIL HFA) 108 (90 BASE) MCG/ACT inhaler  Inhale 1 puff into the lungs every 6 (six) hours as needed for wheezing or shortness of breath.   Yes Historical Provider, MD  diphenhydrAMINE (BENADRYL) 25 mg capsule Take 25 mg by mouth every 6 (six) hours as needed for allergies.   Yes Historical Provider, MD  ibuprofen (ADVIL,MOTRIN) 800 MG tablet Take 1 tablet (800 mg total) by mouth 3 (three) times daily. 08/19/13  Yes Robyn M Hess, PA-C  cephALEXin (KEFLEX) 500 MG capsule Take 1 capsule (500 mg total) by mouth 4 (four) times daily. Patient not taking: Reported on 05/12/2014 08/15/13   Sunnie NielsenBrian Opitz, MD  Fish Oil-Cholecalciferol (FISH OIL + D3 PO) Take 2 capsules by mouth daily.    Historical Provider, MD  hydrochlorothiazide (HYDRODIURIL) 25 MG tablet Take 1 tablet (25 mg total) by mouth daily. 05/12/14   Atlee Kluth A Aubry Rankin, PA-C  ibuprofen (ADVIL,MOTRIN) 600 MG tablet Take 600 mg by mouth every 6 (six) hours as needed for moderate pain.    Historical Provider, MD  labetalol (NORMODYNE) 100 MG tablet Take 1 tablet (100 mg total) by mouth 2 (two) times daily. 05/12/14   Ted Goodner A Lester Platas, PA-C  oxymetazoline (AFRIN NASAL SPRAY) 0.05 % nasal spray Place 1 spray into both nostrils 2 (two) times daily. Patient not taking: Reported on 05/12/2014 08/15/13   Sunnie NielsenBrian Opitz, MD   BP 128/73 mmHg  Pulse 73  Temp(Src) 98.2 F (36.8 C) (Oral)  Resp 20  Ht 5\' 8"  (1.727 m)  SpO2 99%  LMP 04/30/2014 Physical Exam  Constitutional: She is oriented to person, place, and time. She appears well-developed and well-nourished.  HENT:  Head: Normocephalic.  Eyes: Pupils are equal, round, and reactive to light.  Neck: Normal range of motion. Neck supple.  Cardiovascular: Normal rate and regular rhythm.   Pulmonary/Chest: Effort normal and breath sounds normal.  Abdominal: Soft. Bowel sounds are normal. There is no tenderness. There is no rebound and no guarding.  Musculoskeletal: Normal range of motion.  Neurological: She is alert and oriented to person, place, and  time. She has normal strength and normal reflexes. No sensory deficit. She displays a negative Romberg sign. Coordination normal.  CN's 3-12 grossly intact. Speech clear and focused. No deficits of coordination.  Ambulatory without ataxia.  Skin: Skin is warm and dry. No rash noted.  Psychiatric: She has a normal mood and affect.    ED Course  Procedures (including critical care time) Labs Review Labs Reviewed  CBC - Abnormal; Notable for the following:    RBC 5.15 (*)    MCV 77.7 (*)    MCH 25.4 (*)    All other components within normal limits  I-STAT CHEM 8, ED    Imaging Review No results found.   EKG Interpretation None      MDM   Final diagnoses:  Essential hypertension  Generalized headache    She is neurologically intact with significant elevation in blood pressure. Will re-start usual medications and refer back to PCP.    Arnoldo HookerShari A Kevonte Vanecek, PA-C 05/15/14 2223  Olivia Mackielga M Otter, MD 05/18/14 [phone]

## 2015-04-21 ENCOUNTER — Encounter (HOSPITAL_COMMUNITY): Payer: Self-pay | Admitting: Emergency Medicine

## 2015-04-21 ENCOUNTER — Emergency Department (HOSPITAL_COMMUNITY)
Admission: EM | Admit: 2015-04-21 | Discharge: 2015-04-21 | Disposition: A | Discharge status: Home / Self Care | Payer: Medicaid Other | Attending: Emergency Medicine | Admitting: Emergency Medicine

## 2015-04-21 ENCOUNTER — Emergency Department (HOSPITAL_COMMUNITY): Payer: Medicaid Other

## 2015-04-21 DIAGNOSIS — S29002A Unspecified injury of muscle and tendon of back wall of thorax, initial encounter: Secondary | ICD-10-CM | POA: Diagnosis not present

## 2015-04-21 DIAGNOSIS — Z3202 Encounter for pregnancy test, result negative: Secondary | ICD-10-CM | POA: Diagnosis not present

## 2015-04-21 DIAGNOSIS — M5442 Lumbago with sciatica, left side: Secondary | ICD-10-CM | POA: Insufficient documentation

## 2015-04-21 DIAGNOSIS — Y998 Other external cause status: Secondary | ICD-10-CM | POA: Insufficient documentation

## 2015-04-21 DIAGNOSIS — I10 Essential (primary) hypertension: Secondary | ICD-10-CM | POA: Diagnosis not present

## 2015-04-21 DIAGNOSIS — Y9389 Activity, other specified: Secondary | ICD-10-CM | POA: Diagnosis not present

## 2015-04-21 DIAGNOSIS — Z79899 Other long term (current) drug therapy: Secondary | ICD-10-CM | POA: Insufficient documentation

## 2015-04-21 DIAGNOSIS — Y92410 Unspecified street and highway as the place of occurrence of the external cause: Secondary | ICD-10-CM | POA: Diagnosis not present

## 2015-04-21 DIAGNOSIS — S3992XA Unspecified injury of lower back, initial encounter: Secondary | ICD-10-CM | POA: Diagnosis present

## 2015-04-21 LAB — URINALYSIS, ROUTINE W REFLEX MICROSCOPIC
BILIRUBIN URINE: NEGATIVE
Glucose, UA: NEGATIVE mg/dL
Hgb urine dipstick: NEGATIVE
Ketones, ur: NEGATIVE mg/dL
Leukocytes, UA: NEGATIVE
NITRITE: NEGATIVE
PROTEIN: NEGATIVE mg/dL
Specific Gravity, Urine: 1.01 (ref 1.005–1.030)
pH: 7 (ref 5.0–8.0)

## 2015-04-21 LAB — POC URINE PREG, ED: Preg Test, Ur: NEGATIVE

## 2015-04-21 MED ORDER — OXYCODONE HCL 5 MG PO TABS
5.0000 mg | ORAL_TABLET | Freq: Once | ORAL | Status: AC
  Administered 2015-04-21: 5 mg via ORAL
  Filled 2015-04-21: qty 1

## 2015-04-21 MED ORDER — IBUPROFEN 800 MG PO TABS: 800.0000 mg | ORAL_TABLET | Freq: Once | ORAL | Status: DC

## 2015-04-21 MED ORDER — ACETAMINOPHEN 500 MG PO TABS: 1000.0000 mg | ORAL_TABLET | Freq: Once | ORAL | Status: DC

## 2015-04-21 MED ORDER — AZITHROMYCIN 250 MG PO TABS: 1000.0000 mg | ORAL_TABLET | Freq: Once | ORAL | Status: DC

## 2015-04-21 MED ORDER — ACETAMINOPHEN 500 MG PO TABS
1000.0000 mg | ORAL_TABLET | Freq: Once | ORAL | Status: AC
  Administered 2015-04-21: 1000 mg via ORAL
  Filled 2015-04-21: qty 2

## 2015-04-21 MED ORDER — DIAZEPAM 5 MG PO TABS: 2.5000 mg | ORAL_TABLET | Freq: Two times a day (BID) | ORAL | Status: AC

## 2015-04-21 MED ORDER — DEXAMETHASONE 4 MG PO TABS: 10.0000 mg | ORAL_TABLET | Freq: Once | ORAL | Status: DC

## 2015-04-21 MED ORDER — DIAZEPAM 2 MG PO TABS
2.0000 mg | ORAL_TABLET | Freq: Once | ORAL | Status: AC
  Administered 2015-04-21: 2 mg via ORAL
  Filled 2015-04-21: qty 1

## 2015-04-21 MED ORDER — CEFTRIAXONE SODIUM 250 MG IJ SOLR: 250.0000 mg | Freq: Once | INTRAMUSCULAR | Status: DC

## 2015-04-21 NOTE — Discharge Instructions (Signed)
Take 4 over the counter ibuprofen tablets 3 times a day or 2 over-the-counter naproxen tablets twice a day for pain. ° °Sciatica °Sciatica is pain, weakness, numbness, or tingling along the path of the sciatic nerve. The nerve starts in the lower back and runs down the back of each leg. The nerve controls the muscles in the lower leg and in the back of the knee, while also providing sensation to the back of the thigh, lower leg, and the sole of your foot. Sciatica is a symptom of another medical condition. For instance, nerve damage or certain conditions, such as a herniated disk or bone spur on the spine, pinch or put pressure on the sciatic nerve. This causes the pain, weakness, or other sensations normally associated with sciatica. Generally, sciatica only affects one side of the body. °CAUSES  °· Herniated or slipped disc. °· Degenerative disk disease. °· A pain disorder involving the narrow muscle in the buttocks (piriformis syndrome). °· Pelvic injury or fracture. °· Pregnancy. °· Tumor (rare). °SYMPTOMS  °Symptoms can vary from mild to very severe. The symptoms usually travel from the low back to the buttocks and down the back of the leg. Symptoms can include: °· Mild tingling or dull aches in the lower back, leg, or hip. °· Numbness in the back of the calf or sole of the foot. °· Burning sensations in the lower back, leg, or hip. °· Sharp pains in the lower back, leg, or hip. °· Leg weakness. °· Severe back pain inhibiting movement. °These symptoms may get worse with coughing, sneezing, laughing, or prolonged sitting or standing. Also, being overweight may worsen symptoms. °DIAGNOSIS  °Your caregiver will perform a physical exam to look for common symptoms of sciatica. He or she may ask you to do certain movements or activities that would trigger sciatic nerve pain. Other tests may be performed to find the cause of the sciatica. These may include: °· Blood tests. °· X-rays. °· Imaging tests, such as an MRI  or CT scan. °TREATMENT  °Treatment is directed at the cause of the sciatic pain. Sometimes, treatment is not necessary and the pain and discomfort goes away on its own. If treatment is needed, your caregiver may suggest: °· Over-the-counter medicines to relieve pain. °· Prescription medicines, such as anti-inflammatory medicine, muscle relaxants, or narcotics. °· Applying heat or ice to the painful area. °· Steroid injections to lessen pain, irritation, and inflammation around the nerve. °· Reducing activity during periods of pain. °· Exercising and stretching to strengthen your abdomen and improve flexibility of your spine. Your caregiver may suggest losing weight if the extra weight makes the back pain worse. °· Physical therapy. °· Surgery to eliminate what is pressing or pinching the nerve, such as a bone spur or part of a herniated disk. °HOME CARE INSTRUCTIONS  °· Only take over-the-counter or prescription medicines for pain or discomfort as directed by your caregiver. °· Apply ice to the affected area for 20 minutes, 3-4 times a day for the first 48-72 hours. Then try heat in the same way. °· Exercise, stretch, or perform your usual activities if these do not aggravate your pain. °· Attend physical therapy sessions as directed by your caregiver. °· Keep all follow-up appointments as directed by your caregiver. °· Do not wear high heels or shoes that do not provide proper support. °· Check your mattress to see if it is too soft. A firm mattress may lessen your pain and discomfort. °SEEK IMMEDIATE MEDICAL CARE IF:  °·   You lose control of your bowel or bladder (incontinence).  You have increasing weakness in the lower back, pelvis, buttocks, or legs.  You have redness or swelling of your back.  You have a burning sensation when you urinate.  You have pain that gets worse when you lie down or awakens you at night.  Your pain is worse than you have experienced in the past.  Your pain is lasting longer  than 4 weeks.  You are suddenly losing weight without reason. MAKE SURE YOU:  Understand these instructions.  Will watch your condition.  Will get help right away if you are not doing well or get worse.   This information is not intended to replace advice given to you by your health care provider. Make sure you discuss any questions you have with your health care provider.   Document Released: 05/09/2001 Document Revised: 02/03/2015 Document Reviewed: 09/24/2011 Elsevier Interactive Patient Education [employer].
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
## 2015-04-21 NOTE — ED Notes (Signed)
Patient tolerated po challenge without incident.

## 2015-07-20 ENCOUNTER — Emergency Department (HOSPITAL_COMMUNITY): Payer: Medicaid Other

## 2015-07-20 ENCOUNTER — Emergency Department (HOSPITAL_COMMUNITY)
Admission: EM | Admit: 2015-07-20 | Discharge: 2015-07-20 | Disposition: A | Discharge status: Home / Self Care | Payer: Medicaid Other | Attending: Emergency Medicine | Admitting: Emergency Medicine

## 2015-07-20 ENCOUNTER — Encounter (HOSPITAL_COMMUNITY): Payer: Self-pay | Admitting: Emergency Medicine

## 2015-07-20 DIAGNOSIS — R1033 Periumbilical pain: Secondary | ICD-10-CM | POA: Insufficient documentation

## 2015-07-20 DIAGNOSIS — I10 Essential (primary) hypertension: Secondary | ICD-10-CM | POA: Insufficient documentation

## 2015-07-20 DIAGNOSIS — R519 Headache, unspecified: Secondary | ICD-10-CM

## 2015-07-20 DIAGNOSIS — Z3202 Encounter for pregnancy test, result negative: Secondary | ICD-10-CM | POA: Insufficient documentation

## 2015-07-20 DIAGNOSIS — R51 Headache: Secondary | ICD-10-CM | POA: Insufficient documentation

## 2015-07-20 DIAGNOSIS — Z79899 Other long term (current) drug therapy: Secondary | ICD-10-CM | POA: Insufficient documentation

## 2015-07-20 DIAGNOSIS — R935 Abnormal findings on diagnostic imaging of other abdominal regions, including retroperitoneum: Secondary | ICD-10-CM

## 2015-07-20 DIAGNOSIS — R6889 Other general symptoms and signs: Secondary | ICD-10-CM

## 2015-07-20 DIAGNOSIS — R110 Nausea: Secondary | ICD-10-CM

## 2015-07-20 LAB — URINALYSIS, ROUTINE W REFLEX MICROSCOPIC
BILIRUBIN URINE: NEGATIVE
Glucose, UA: NEGATIVE mg/dL
HGB URINE DIPSTICK: NEGATIVE
KETONES UR: 15 mg/dL — AB
Leukocytes, UA: NEGATIVE
NITRITE: NEGATIVE
PROTEIN: NEGATIVE mg/dL
SPECIFIC GRAVITY, URINE: 1.026 (ref 1.005–1.030)
pH: 6 (ref 5.0–8.0)

## 2015-07-20 LAB — COMPREHENSIVE METABOLIC PANEL
ALBUMIN: 3.7 g/dL (ref 3.5–5.0)
ALT: 29 U/L (ref 14–54)
ANION GAP: 8 (ref 5–15)
AST: 25 U/L (ref 15–41)
Alkaline Phosphatase: 42 U/L (ref 38–126)
BILIRUBIN TOTAL: 0.4 mg/dL (ref 0.3–1.2)
BUN: 6 mg/dL (ref 6–20)
CHLORIDE: 105 mmol/L (ref 101–111)
CO2: 22 mmol/L (ref 22–32)
Calcium: 9.1 mg/dL (ref 8.9–10.3)
Creatinine, Ser: 0.97 mg/dL (ref 0.44–1.00)
GFR calc Af Amer: >60 mL/min (ref >60)
GFR calc non Af Amer: >60 mL/min (ref >60)
GLUCOSE: 103 mg/dL — AB (ref 65–99)
POTASSIUM: 4.4 mmol/L (ref 3.5–5.1)
SODIUM: 135 mmol/L (ref 135–145)
TOTAL PROTEIN: 7.2 g/dL (ref 6.5–8.1)

## 2015-07-20 LAB — CBC
HEMATOCRIT: 42.9 % (ref 36.0–46.0)
HEMOGLOBIN: 14.2 g/dL (ref 12.0–15.0)
MCH: 25.7 pg — ABNORMAL LOW (ref 26.0–34.0)
MCHC: 33.1 g/dL (ref 30.0–36.0)
MCV: 77.7 fL — ABNORMAL LOW (ref 78.0–100.0)
Platelets: 258 10*3/uL (ref 150–400)
RBC: 5.52 MIL/uL — ABNORMAL HIGH (ref 3.87–5.11)
RDW: 15.2 % (ref 11.5–15.5)
WBC: 7.1 10*3/uL (ref 4.0–10.5)

## 2015-07-20 LAB — I-STAT CG4 LACTIC ACID, ED: Lactic Acid, Venous: 1.28 mmol/L (ref 0.5–2.0)

## 2015-07-20 LAB — LIPASE, BLOOD: LIPASE: 25 U/L (ref 11–51)

## 2015-07-20 LAB — I-STAT BETA HCG BLOOD, ED (MC, WL, AP ONLY)

## 2015-07-20 MED ORDER — METOCLOPRAMIDE HCL 5 MG/ML IJ SOLN
10.0000 mg | Freq: Once | INTRAMUSCULAR | Status: AC
  Administered 2015-07-20: 10 mg via INTRAVENOUS
  Filled 2015-07-20: qty 2

## 2015-07-20 MED ORDER — OXYCODONE-ACETAMINOPHEN 5-325 MG PO TABS
1.0000 | ORAL_TABLET | Freq: Once | ORAL | Status: AC
  Administered 2015-07-20: 1 via ORAL

## 2015-07-20 MED ORDER — SODIUM CHLORIDE 0.9 % IV BOLUS (SEPSIS)
1000.0000 mL | Freq: Once | INTRAVENOUS | Status: AC
  Administered 2015-07-20: 1000 mL via INTRAVENOUS

## 2015-07-20 MED ORDER — OXYCODONE-ACETAMINOPHEN 5-325 MG PO TABS
ORAL_TABLET | ORAL | Status: AC
  Filled 2015-07-20: qty 1

## 2015-07-20 MED ORDER — ONDANSETRON HCL 4 MG PO TABS: 4.0000 mg | ORAL_TABLET | Freq: Four times a day (QID) | ORAL | Status: DC

## 2015-07-20 MED ORDER — IOHEXOL 300 MG/ML  SOLN
100.0000 mL | Freq: Once | INTRAMUSCULAR | Status: AC | PRN
  Administered 2015-07-20: 100 mL via INTRAVENOUS

## 2015-07-20 NOTE — ED Provider Notes (Signed)
History  By signing my name below, I, Karle Plumber, attest that this documentation has been prepared under the direction and in the presence of Xavi Tomasik, PA-C. Electronically Signed: Karle Plumber, ED Scribe. 07/20/2015. 9:23 PM  Chief Complaint  Patient presents with  . Generalized Body Aches  . Nausea   The history is provided by the patient and medical records. No language interpreter was used.    HPI Comments:  Courtney Mays is a 40 y.o. female who presents to the Emergency Department complaining of worsening generalized myalgias and nausea that began approximately three days ago. She reports associated subjective fever, throbbing headache and mid-abdominal pain that began last night. She states her headache is throbbing over her forehead. Denies exacerbating or alleviating factors of the headache. Her abdominal pain is generalized but mostly in the peri-umbilical area. She states it is a sharp pain and has been constant since last evening. She does note she has been nausea but has not vomited. Pressing on her abdomen increases the pain. She denies alleviating factors of the abdominal pain. She has taken Tylenol with no significant relief of the pain. She denies chills, dizziness, syncope, vision changes, URI symptoms, neck pain, neck stiffness, chest pain, cough, SOB, diarrhea, dysuria, malodorous urine, vaginal discharge or pelvic pain. She states the Percocet she was given in triage has not helped her pain but reports falling asleep in the waiting room. She did not receive her flu shot this year. Unknown sick contacts.   Past Medical History  Diagnosis Date  . Hypertension    History reviewed. No pertinent past surgical history. History reviewed. No pertinent family history. Social History  Substance Use Topics  . Smoking status: Never Smoker   . Smokeless tobacco: None  . Alcohol Use: No   OB History    No data available     Review of Systems  Constitutional:  Positive for fever (subjective). Negative for activity change and appetite change.  Eyes: Negative for visual disturbance.  Gastrointestinal: Positive for nausea and abdominal pain. Negative for vomiting and diarrhea.  Musculoskeletal: Positive for myalgias.  Neurological: Positive for headaches. Negative for dizziness, syncope and weakness.  All other systems reviewed and are negative.   Allergies  Review of patient's allergies indicates no known allergies.  Home Medications   Prior to Admission medications   Medication Sig Start Date End Date Taking? Authorizing Provider  acetaminophen (TYLENOL) 650 MG CR tablet Take 650 mg by mouth every 8 (eight) hours as needed for pain.    Historical Provider, MD  albuterol (PROVENTIL HFA) 108 (90 BASE) MCG/ACT inhaler Inhale 1 puff into the lungs every 6 (six) hours as needed for wheezing or shortness of breath.    Historical Provider, MD  cephALEXin (KEFLEX) 500 MG capsule Take 1 capsule (500 mg total) by mouth 4 (four) times daily. Patient not taking: Reported on 05/12/2014 08/15/13   Sunnie Nielsen, MD  diazepam (VALIUM) 5 MG tablet Take 0.5 tablets (2.5 mg total) by mouth 2 (two) times daily. 04/21/15   Melene Plan, DO  diphenhydrAMINE (BENADRYL) 25 mg capsule Take 25 mg by mouth every 6 (six) hours as needed for allergies.    Historical Provider, MD  Fish Oil-Cholecalciferol (FISH OIL + D3 PO) Take 2 capsules by mouth daily.    Historical Provider, MD  hydrochlorothiazide (HYDRODIURIL) 25 MG tablet Take 1 tablet (25 mg total) by mouth daily. 05/12/14   Elpidio Anis, PA-C  ibuprofen (ADVIL,MOTRIN) 600 MG tablet Take 600 mg by mouth  every 6 (six) hours as needed for moderate pain.    Historical Provider, MD  ibuprofen (ADVIL,MOTRIN) 800 MG tablet Take 1 tablet (800 mg total) by mouth 3 (three) times daily. Patient not taking: Reported on 04/21/2015 08/19/13   Kathrynn Speed, PA-C  labetalol (NORMODYNE) 100 MG tablet Take 1 tablet (100 mg total) by mouth 2  (two) times daily. 05/12/14   Elpidio Anis, PA-C  ondansetron (ZOFRAN) 4 MG tablet Take 1 tablet (4 mg total) by mouth every 6 (six) hours. 07/20/15   Rolm Gala Jafet Wissing, PA-C  oxymetazoline (AFRIN NASAL SPRAY) 0.05 % nasal spray Place 1 spray into both nostrils 2 (two) times daily. Patient not taking: Reported on 05/12/2014 08/15/13   Sunnie Nielsen, MD   Triage Vitals: BP 130/88 mmHg  Pulse 110  Temp(Src) 98.7 F (37.1 C) (Oral)  Resp 18  SpO2 99% Physical Exam  Constitutional: She appears well-developed and well-nourished. No distress.  Patient sleeping comfortably upon entering exam room  HENT:  Head: Normocephalic and atraumatic.  Right Ear: External ear normal.  Left Ear: External ear normal.  Mouth/Throat: Oropharynx is clear and moist. No oropharyngeal exudate.  Eyes: Conjunctivae and EOM are normal. Pupils are equal, round, and reactive to light. Right eye exhibits no discharge. Left eye exhibits no discharge. No scleral icterus.  Neck: Normal range of motion. Neck supple. No rigidity.  No meningeal signs  Cardiovascular: Normal rate, regular rhythm and normal heart sounds.   Pulmonary/Chest: Effort normal and breath sounds normal. No respiratory distress.  Abdominal: Soft. There is generalized tenderness. There is no rebound, no guarding and no CVA tenderness.  Mild generalized tenderness without rebound, guarding or rigidity.  Musculoskeletal: Normal range of motion.  Moves all extremities spontaneously  Neurological: She is alert. No cranial nerve deficit. She exhibits normal muscle tone. Coordination normal.  Cranial nerves 3-12 tested and intact. 5/5 strength of all major muscle groups. Sensation to light touch intact throughout. Finger to nose coordinated. Walks with a steady gait unassisted.   Skin: Skin is warm and dry.  Psychiatric: She has a normal mood and affect. Her behavior is normal.  Nursing note and vitals reviewed.   ED Course  Procedures (including critical care  time) DIAGNOSTIC STUDIES: Oxygen Saturation is 99% on RA, normal by my interpretation.   COORDINATION OF CARE: 4:19 PM- Will order CT abdomen. Pt verbalizes understanding and agrees to plan.  Medications  oxyCODONE-acetaminophen (PERCOCET/ROXICET) 5-325 MG per tablet 1 tablet (1 tablet Oral Given 07/20/15 1132)  sodium chloride 0.9 % bolus 1,000 mL (0 mLs Intravenous Stopped 07/20/15 1853)  metoCLOPramide (REGLAN) injection 10 mg (10 mg Intravenous Given 07/20/15 1702)  iohexol (OMNIPAQUE) 300 MG/ML solution 100 mL (100 mLs Intravenous Contrast Given 07/20/15 1842)   Labs Review Labs Reviewed  COMPREHENSIVE METABOLIC PANEL - Abnormal; Notable for the following:    Glucose, Bld 103 (*)    All other components within normal limits  CBC - Abnormal; Notable for the following:    RBC 5.52 (*)    MCV 77.7 (*)    MCH 25.7 (*)    All other components within normal limits  URINALYSIS, ROUTINE W REFLEX MICROSCOPIC (NOT AT [facility]) - Abnormal; Notable for the following:    Ketones, ur 15 (*)    All other components within normal limits  LIPASE, BLOOD  I-STAT BETA HCG BLOOD, ED ([facility], [facility], [facility] ONLY)  I-STAT CG4 LACTIC ACID, ED    Imaging Review US Transvaginal Non-ob  07/20/2015  CLINICAL DATA:  Initial valuation for abnormal CT, free fluid in pelvis. EXAM: TRANSABDOMINAL AND TRANSVAGINAL ULTRASOUND OF PELVIS TECHNIQUE: Both transabdominal and transvaginal ultrasound examinations of the pelvis were performed. Transabdominal technique was performed for global imaging of the pelvis including uterus, ovaries, adnexal regions, and pelvic cul-de-sac. It was necessary to proceed with endovaginal exam following the transabdominal exam to visualize the uterus and ovaries. COMPARISON:  Prior CT from earlier the same day. FINDINGS: Uterus Measurements: 8.4 x 4.8 x 6.0 cm. No fibroids or other mass visualized. Uterus was retroverted. Nabothian cyst noted at the cervix. Endometrium Thickness: 4.0 mm.  No focal  abnormality visualized. Right ovary Measurements: 3.6 x 2.8 x 3.3 cm. Normal appearance/no adnexal mass. Small 1.3 x 0.9 x 0.7 cyst, likely a normal dominant follicle. Left ovary Measurements: 3.6 x 1.4 x 2.6 cm. Normal appearance/no adnexal mass. Other findings Small volume free fluid, similar relative to prior CT. IMPRESSION: Small volume free fluid within the pelvis, likely physiologic. Otherwise normal pelvic ultrasound. Electronically Signed   By: Rise Mu M.D.   On: 07/20/2015 21:14   US Pelvis Complete  07/20/2015  CLINICAL DATA:  Initial valuation for abnormal CT, free fluid in pelvis. EXAM: TRANSABDOMINAL AND TRANSVAGINAL ULTRASOUND OF PELVIS TECHNIQUE: Both transabdominal and transvaginal ultrasound examinations of the pelvis were performed. Transabdominal technique was performed for global imaging of the pelvis including uterus, ovaries, adnexal regions, and pelvic cul-de-sac. It was necessary to proceed with endovaginal exam following the transabdominal exam to visualize the uterus and ovaries. COMPARISON:  Prior CT from earlier the same day. FINDINGS: Uterus Measurements: 8.4 x 4.8 x 6.0 cm. No fibroids or other mass visualized. Uterus was retroverted. Nabothian cyst noted at the cervix. Endometrium Thickness: 4.0 mm.  No focal abnormality visualized. Right ovary Measurements: 3.6 x 2.8 x 3.3 cm. Normal appearance/no adnexal mass. Small 1.3 x 0.9 x 0.7 cyst, likely a normal dominant follicle. Left ovary Measurements: 3.6 x 1.4 x 2.6 cm. Normal appearance/no adnexal mass. Other findings Small volume free fluid, similar relative to prior CT. IMPRESSION: Small volume free fluid within the pelvis, likely physiologic. Otherwise normal pelvic ultrasound. Electronically Signed   By: Rise Mu M.D.   On: 07/20/2015 21:14   Ct Abdomen Pelvis W Contrast  07/20/2015  CLINICAL DATA:  Mid to lower abdominal pain with nausea beginning 2 days ago. EXAM: CT ABDOMEN AND PELVIS WITH CONTRAST  TECHNIQUE: Multidetector CT imaging of the abdomen and pelvis was performed using the standard protocol following bolus administration of intravenous contrast. CONTRAST:  OMNIPAQUE IOHEXOL 300 MG/ML  SOLN COMPARISON:  08/15/2013 ultrasound FINDINGS: Lower chest:  Unremarkable Hepatobiliary: Unremarkable Pancreas: Unremarkable Spleen: Unremarkable Adrenals/Urinary Tract: Unremarkable Stomach/Bowel: Unremarkable.  Appendix normal. Vascular/Lymphatic: Unremarkable Reproductive: Small but abnormal amount of free pelvic fluid. Indistinct potential hypodense lesion of the right ovary about 2 point 3 cm diameter. Retroverted uterus. Other: No supplemental non-categorized findings. Musculoskeletal: Lumbar spondylosis and degenerative disc disease causing central narrowing of the thecal sac at L4-5 and bilateral foraminal stenosis at L3-4 and L4-5. Slightly transitional L5 vertebra. There is a fairly notable central disc protrusion at L4-5 extending caudad. IMPRESSION: 1. Small but abnormal amount of free pelvic fluid. Hypodense lesion of the right ovary, consider pelvic sonography for further workup. 2. Lumbar spondylosis and degenerative disc disease causing impingement at L4-5 and L3-4. Electronically Signed   By: Gaylyn Rong M.D.   On: 07/20/2015 19:01   I have personally reviewed and evaluated these images and lab results as part of  my medical decision-making.   MDM   Final diagnoses:  Flu-like symptoms  Nausea  Periumbilical abdominal pain  Headache, unspecified headache type   40 year old female presenting with 3 days of myalgia, nausea, subjective fevers, abdominal pain and throbbing headache. Afebrile on presentation to emergency department. Patient is nontoxic-appearing and in no acute distress. Patient sleeping comfortably upon entering exam room and promptly falls back asleep after leaving. Nonfocal neuro exam. No meningeal signs. Lungs clear to auscultation bilaterally. Abdomen with  generalized tenderness without peritoneal signs. Symptoms treated with Reglan and Percocet. Fluid bolus given. She reports significant improvement in symptoms after medications. Blood work unremarkable. Urine without signs of infection. CT abdomen and showed small amount of free pelvic fluid but no other intra-abdominal abnormalities. Recommended pelvic ultrasound for follow-up without abnormality. Patient symptoms adequately managed in the emergency department. Patient likely has viral illness. We'll discharge home with symptom control including Zofran. Patient is to follow-up with her PCP in the next few days if her symptoms do not improve. Discussed fever control with Tylenol or Motrin. Patient is in no acute distress before discharge and has had all questions answered. Return precautions given in discharge paperwork and discussed with pt at bedside. Pt stable for discharge  I personally performed the services described in this documentation, which was scribed in my presence. The recorded information has been reviewed and is accurate.     Rolm Gala Jamol Ginyard, PA-C 07/20/15 2244  Rolland Porter, MD 07/26/15 [phone]

## 2015-07-20 NOTE — ED Notes (Signed)
Pt sts body aches with fever and nausea x 2 days

## 2015-07-20 NOTE — Discharge Instructions (Signed)
Schedule a follow-up appointment with your PCP or one from the resource guide if your symptoms do not improve. Stay well hydrated. Take the Zofran for nausea. Use over-the-counter medications such as Tylenol or Motrin for pain control.   Abdominal Pain, Adult Many things can cause abdominal pain. Usually, abdominal pain is not caused by a disease and will improve without treatment. It can often be observed and treated at home. Your health care provider will do a physical exam and possibly order blood tests and X-rays to help determine the seriousness of your pain. However, in many cases, more time must pass before a clear cause of the pain can be found. Before that point, your health care provider may not know if you need more testing or further treatment. HOME CARE INSTRUCTIONS Monitor your abdominal pain for any changes. The following actions may help to alleviate any discomfort you are experiencing:  Only take over-the-counter or prescription medicines as directed by your health care provider.  Do not take laxatives unless directed to do so by your health care provider.  Try a clear liquid diet (broth, tea, or water) as directed by your health care provider. Slowly move to a bland diet as tolerated. SEEK MEDICAL CARE IF:  You have unexplained abdominal pain.  You have abdominal pain associated with nausea or diarrhea.  You have pain when you urinate or have a bowel movement.  You experience abdominal pain that wakes you in the night.  You have abdominal pain that is worsened or improved by eating food.  You have abdominal pain that is worsened with eating fatty foods.  You have a fever. SEEK IMMEDIATE MEDICAL CARE IF:  Your pain does not go away within 2 hours.  You keep throwing up (vomiting).  Your pain is felt only in portions of the abdomen, such as the right side or the left lower portion of the abdomen.  You pass bloody or black tarry stools. MAKE SURE YOU:  Understand  these instructions.  Will watch your condition.  Will get help right away if you are not doing well or get worse.   This information is not intended to replace advice given to you by your health care provider. Make sure you discuss any questions you have with your health care provider.   Document Released: 02/22/2005 Document Revised: 02/03/2015 Document Reviewed: 01/22/2013 Elsevier Interactive Patient Education 2016 [employer]  General Headache Without Cause A headache is pain or discomfort felt around the head or neck area. The specific cause of a headache may not be found. There are many causes and types of headaches. A few common ones are:  Tension headaches.  Migraine headaches.  Cluster headaches.  Chronic daily headaches. HOME CARE INSTRUCTIONS  Watch your condition for any changes. Take these steps to help with your condition: Managing Pain  Take over-the-counter and prescription medicines only as told by your health care provider.  Lie down in a dark, quiet room when you have a headache.  If directed, apply ice to the head and neck area:  Put ice in a plastic bag.  Place a towel between your skin and the bag.  Leave the ice on for 20 minutes, 2-3 times per day.  Use a heating pad or hot shower to apply heat to the head and neck area as told by your health care provider.  Keep lights dim if bright lights bother you or make your headaches worse. Eating and Drinking  Eat meals on a regular schedule.  Limit alcohol use.  Decrease the amount of caffeine you drink, or stop drinking caffeine. General Instructions  Keep all follow-up visits as told by your health care provider. This is important.  Keep a headache journal to help find out what may trigger your headaches. For example, write down:  What you eat and drink.  How much sleep you get.  Any change to your diet or medicines.  Try massage or other relaxation techniques.  Limit stress.  Sit up  straight, and do not tense your muscles.  Do not use tobacco products, including cigarettes, chewing tobacco, or e-cigarettes. If you need help quitting, ask your health care provider.  Exercise regularly as told by your health care provider.  Sleep on a regular schedule. Get 7-9 hours of sleep, or the amount recommended by your health care provider. SEEK MEDICAL CARE IF:   Your symptoms are not helped by medicine.  You have a headache that is different from the usual headache.  You have nausea or you vomit.  You have a fever. SEEK IMMEDIATE MEDICAL CARE IF:   Your headache becomes severe.  You have repeated vomiting.  You have a stiff neck.  You have a loss of vision.  You have problems with speech.  You have pain in the eye or ear.  You have muscular weakness or loss of muscle control.  You lose your balance or have trouble walking.  You feel faint or pass out.  You have confusion.   This information is not intended to replace advice given to you by your health care provider. Make sure you discuss any questions you have with your health care provider.   Document Released: 05/15/2005 Document Revised: 02/03/2015 Document Reviewed: 09/07/2014 Elsevier Interactive Patient Education 2016 [employer] Inc[employer]  Nausea, Adult Nausea is the feeling that you have an upset stomach or have to vomit. Nausea by itself is not likely a serious concern, but it may be an early sign of more serious medical problems. As nausea gets worse, it can lead to vomiting. If vomiting develops, there is the risk of dehydration.  CAUSES   Viral infections.  Food poisoning.  Medicines.  Pregnancy.  Motion sickness.  Migraine headaches.  Emotional distress.  Severe pain from any source.  Alcohol intoxication. HOME CARE INSTRUCTIONS  Get plenty of rest.  Ask your caregiver about specific rehydration instructions.  Eat small amounts of food and sip liquids more often.  Take all  medicines as told by your caregiver. SEEK MEDICAL CARE IF:  You have not improved after 2 days, or you get worse.  You have a headache. SEEK IMMEDIATE MEDICAL CARE IF:   You have a fever.  You faint.  You keep vomiting or have blood in your vomit.  You are extremely weak or dehydrated.  You have dark or bloody stools.  You have severe chest or abdominal pain. MAKE SURE YOU:  Understand these instructions.  Will watch your condition.  Will get help right away if you are not doing well or get worse.   This information is not intended to replace advice given to you by your health care provider. Make sure you discuss any questions you have with your health care provider.   Document Released: 06/22/2004 Document Revised: 06/05/2014 Document Reviewed: 01/25/2011 Elsevier Interactive Patient Education [employer]

## 2016-01-13 ENCOUNTER — Encounter (HOSPITAL_COMMUNITY): Payer: Self-pay | Admitting: Emergency Medicine

## 2016-01-13 DIAGNOSIS — Z5321 Procedure and treatment not carried out due to patient leaving prior to being seen by health care provider: Secondary | ICD-10-CM | POA: Insufficient documentation

## 2016-01-13 DIAGNOSIS — R1084 Generalized abdominal pain: Secondary | ICD-10-CM | POA: Diagnosis present

## 2016-01-13 LAB — COMPREHENSIVE METABOLIC PANEL
ALBUMIN: 3.9 g/dL (ref 3.5–5.0)
ALT: 40 U/L (ref 14–54)
ANION GAP: 7 (ref 5–15)
AST: 31 U/L (ref 15–41)
Alkaline Phosphatase: 41 U/L (ref 38–126)
BILIRUBIN TOTAL: 1 mg/dL (ref 0.3–1.2)
BUN: 11 mg/dL (ref 6–20)
CO2: 24 mmol/L (ref 22–32)
Calcium: 9 mg/dL (ref 8.9–10.3)
Chloride: 104 mmol/L (ref 101–111)
Creatinine, Ser: 0.79 mg/dL (ref 0.44–1.00)
Glucose, Bld: 104 mg/dL — ABNORMAL HIGH (ref 65–99)
POTASSIUM: 2.8 mmol/L — AB (ref 3.5–5.1)
Sodium: 135 mmol/L (ref 135–145)
TOTAL PROTEIN: 7.1 g/dL (ref 6.5–8.1)

## 2016-01-13 LAB — CBC
HEMATOCRIT: 37.5 % (ref 36.0–46.0)
HEMOGLOBIN: 12.3 g/dL (ref 12.0–15.0)
MCH: 25.9 pg — ABNORMAL LOW (ref 26.0–34.0)
MCHC: 32.8 g/dL (ref 30.0–36.0)
MCV: 79.1 fL (ref 78.0–100.0)
Platelets: 182 10*3/uL (ref 150–400)
RBC: 4.74 MIL/uL (ref 3.87–5.11)
RDW: 14.9 % (ref 11.5–15.5)
WBC: 6.6 10*3/uL (ref 4.0–10.5)

## 2016-01-13 LAB — LIPASE, BLOOD: Lipase: 27 U/L (ref 11–51)

## 2016-01-13 NOTE — ED Triage Notes (Signed)
Patient c/o generalized abdominal pain x3 days, fever (102 at home), one episode of watery diarrhea and nausea. Denies urinary symptoms. A&O x4.

## 2016-01-14 ENCOUNTER — Emergency Department (HOSPITAL_COMMUNITY)
Admission: EM | Admit: 2016-01-14 | Discharge: 2016-01-14 | Disposition: A | Discharge status: Home / Self Care | Payer: Medicaid Other | Attending: Dermatology | Admitting: Dermatology

## 2016-01-14 NOTE — ED Notes (Signed)
Pt called for rooming twice now. No response.

## 2016-01-16 ENCOUNTER — Encounter (HOSPITAL_COMMUNITY): Payer: Self-pay

## 2016-01-16 ENCOUNTER — Emergency Department (HOSPITAL_COMMUNITY)
Admission: EM | Admit: 2016-01-16 | Discharge: 2016-01-17 | Disposition: A | Discharge status: Home / Self Care | Payer: Medicaid Other | Attending: Emergency Medicine | Admitting: Emergency Medicine

## 2016-01-16 DIAGNOSIS — R509 Fever, unspecified: Secondary | ICD-10-CM | POA: Diagnosis not present

## 2016-01-16 DIAGNOSIS — Z79899 Other long term (current) drug therapy: Secondary | ICD-10-CM | POA: Diagnosis not present

## 2016-01-16 DIAGNOSIS — I10 Essential (primary) hypertension: Secondary | ICD-10-CM | POA: Insufficient documentation

## 2016-01-16 DIAGNOSIS — R112 Nausea with vomiting, unspecified: Secondary | ICD-10-CM | POA: Diagnosis present

## 2016-01-16 LAB — DIFFERENTIAL
Basophils Absolute: 0 10*3/uL (ref 0.0–0.1)
Basophils Relative: 0 %
EOS PCT: 0 %
Eosinophils Absolute: 0 10*3/uL (ref 0.0–0.7)
LYMPHS ABS: 1.5 10*3/uL (ref 0.7–4.0)
LYMPHS PCT: 22 %
MONO ABS: 0.6 10*3/uL (ref 0.1–1.0)
MONOS PCT: 9 %
Neutro Abs: 4.6 10*3/uL (ref 1.7–7.7)
Neutrophils Relative %: 69 %

## 2016-01-16 LAB — COMPREHENSIVE METABOLIC PANEL
ALT: 42 U/L (ref 14–54)
AST: 40 U/L (ref 15–41)
Albumin: 4 g/dL (ref 3.5–5.0)
Alkaline Phosphatase: 44 U/L (ref 38–126)
Anion gap: 8 (ref 5–15)
BUN: 18 mg/dL (ref 6–20)
CO2: 26 mmol/L (ref 22–32)
Calcium: 9 mg/dL (ref 8.9–10.3)
Chloride: 102 mmol/L (ref 101–111)
Creatinine, Ser: 1.09 mg/dL — ABNORMAL HIGH (ref 0.44–1.00)
GFR calc Af Amer: >60 mL/min (ref >60)
GFR calc non Af Amer: >60 mL/min (ref >60)
Glucose, Bld: 117 mg/dL — ABNORMAL HIGH (ref 65–99)
Potassium: 2.8 mmol/L — ABNORMAL LOW (ref 3.5–5.1)
Sodium: 136 mmol/L (ref 135–145)
Total Bilirubin: 1.4 mg/dL — ABNORMAL HIGH (ref 0.3–1.2)
Total Protein: 7.7 g/dL (ref 6.5–8.1)

## 2016-01-16 LAB — CBC
HCT: 35.9 % — ABNORMAL LOW (ref 36.0–46.0)
Hemoglobin: 12.4 g/dL (ref 12.0–15.0)
MCH: 26.4 pg (ref 26.0–34.0)
MCHC: 34.5 g/dL (ref 30.0–36.0)
MCV: 76.4 fL — ABNORMAL LOW (ref 78.0–100.0)
Platelets: 149 10*3/uL — ABNORMAL LOW (ref 150–400)
RBC: 4.7 MIL/uL (ref 3.87–5.11)
RDW: 14.9 % (ref 11.5–15.5)
WBC: 6.8 10*3/uL (ref 4.0–10.5)

## 2016-01-16 LAB — URINALYSIS, ROUTINE W REFLEX MICROSCOPIC
BILIRUBIN URINE: NEGATIVE
GLUCOSE, UA: NEGATIVE mg/dL
HGB URINE DIPSTICK: NEGATIVE
KETONES UR: NEGATIVE mg/dL
Leukocytes, UA: NEGATIVE
Nitrite: NEGATIVE
PH: 6 (ref 5.0–8.0)
Protein, ur: NEGATIVE mg/dL
SPECIFIC GRAVITY, URINE: 1.02 (ref 1.005–1.030)

## 2016-01-16 LAB — LIPASE, BLOOD: Lipase: 31 U/L (ref 11–51)

## 2016-01-16 LAB — MAGNESIUM: Magnesium: 2 mg/dL (ref 1.7–2.4)

## 2016-01-16 MED ORDER — ONDANSETRON HCL 4 MG/2ML IJ SOLN
4.0000 mg | Freq: Once | INTRAMUSCULAR | Status: AC
  Administered 2016-01-16: 4 mg via INTRAVENOUS
  Filled 2016-01-16: qty 2

## 2016-01-16 MED ORDER — POTASSIUM CHLORIDE 10 MEQ/100ML IV SOLN
10.0000 meq | Freq: Once | INTRAVENOUS | Status: AC
  Administered 2016-01-16: 10 meq via INTRAVENOUS
  Filled 2016-01-16: qty 100

## 2016-01-16 MED ORDER — POTASSIUM CHLORIDE CRYS ER 20 MEQ PO TBCR
40.0000 meq | EXTENDED_RELEASE_TABLET | Freq: Once | ORAL | Status: AC
  Administered 2016-01-16: 40 meq via ORAL
  Filled 2016-01-16: qty 2

## 2016-01-16 MED ORDER — ACETAMINOPHEN 325 MG PO TABS
650.0000 mg | ORAL_TABLET | Freq: Once | ORAL | Status: AC
  Administered 2016-01-17: 650 mg via ORAL
  Filled 2016-01-16: qty 2

## 2016-01-16 MED ORDER — SODIUM CHLORIDE 0.9 % IV BOLUS (SEPSIS)
1000.0000 mL | Freq: Once | INTRAVENOUS | Status: AC
  Administered 2016-01-16: 1000 mL via INTRAVENOUS

## 2016-01-16 MED ORDER — KETOROLAC TROMETHAMINE 30 MG/ML IJ SOLN
30.0000 mg | Freq: Once | INTRAMUSCULAR | Status: AC
  Administered 2016-01-16: 30 mg via INTRAVENOUS
  Filled 2016-01-16: qty 1

## 2016-01-16 NOTE — ED Triage Notes (Signed)
Patient c/o N/V and fever x1 week.  [facility].  Per EMS patient has not taken any medications for fever or N/V.

## 2016-01-17 ENCOUNTER — Emergency Department (HOSPITAL_COMMUNITY): Payer: Medicaid Other

## 2016-01-17 LAB — I-STAT CG4 LACTIC ACID, ED: Lactic Acid, Venous: 0.73 mmol/L (ref 0.5–1.9)

## 2016-01-17 MED ORDER — SODIUM CHLORIDE 0.9 % IV BOLUS (SEPSIS)
1000.0000 mL | Freq: Once | INTRAVENOUS | Status: AC
  Administered 2016-01-17: 1000 mL via INTRAVENOUS

## 2016-01-17 MED ORDER — ONDANSETRON HCL 4 MG PO TABS: 4.0000 mg | ORAL_TABLET | Freq: Four times a day (QID) | ORAL | 0 refills | Status: AC

## 2016-01-17 MED ORDER — BACITRACIN ZINC 500 UNIT/GM EX OINT: 1.0000 "application " | TOPICAL_OINTMENT | Freq: Two times a day (BID) | CUTANEOUS | 0 refills | Status: AC

## 2016-01-17 NOTE — ED Provider Notes (Signed)
[facility]-EMERGENCY DEPT Provider Note   CSN: [id number] Arrival date & time: 01/16/16  2113     History   Chief Complaint Chief Complaint  Patient presents with  . Nausea  . Emesis    HPI Courtney Mays is a 40 y.o. female with a pmhx of HTN presents to the ED today Complaining of vomiting, diarrhea and abdominal pain times one week. Patient has associated fevers. She's been taking Tylenol at home without relief. Patient states her abdominal pain is diffuse and described as a cramping sensation. Patient states her diarrhea is watery. No melena, hematochezia or hematemesis. No dysuria. Patient states she came to the ED 2 days ago to be evaluated for these symptoms and later 6 hours but left prior to being seen. Patient denies any recent travel or sick contacts. No recent antibiotic use. No new medications. Denies SOB, CP, dizziness, syncope.   HPI  Past Medical History:  Diagnosis Date  . Hypertension     There are no active problems to display for this patient.   History reviewed. No pertinent surgical history.  OB History    No data available       Home Medications    Prior to Admission medications   Medication Sig Start Date End Date Taking? Authorizing Provider  acetaminophen (TYLENOL) 650 MG CR tablet Take 650 mg by mouth every 8 (eight) hours as needed for pain.   Yes Historical Provider, MD  albuterol (PROVENTIL HFA) 108 (90 BASE) MCG/ACT inhaler Inhale 1 puff into the lungs every 6 (six) hours as needed for wheezing or shortness of breath.   Yes Historical Provider, MD  diphenhydrAMINE (BENADRYL) 25 mg capsule Take 25 mg by mouth every 6 (six) hours as needed for allergies.   Yes Historical Provider, MD  hydrochlorothiazide (HYDRODIURIL) 25 MG tablet Take 1 tablet (25 mg total) by mouth daily. 05/12/14  Yes Shari Upstill, PA-C  ibuprofen (ADVIL,MOTRIN) 600 MG tablet Take 600 mg by mouth every 6 (six) hours as needed for moderate pain.   Yes Historical Provider, MD    ibuprofen (ADVIL,MOTRIN) 800 MG tablet Take 1 tablet (800 mg total) by mouth 3 (three) times daily. 08/19/13  Yes Robyn M Hess, PA-C  labetalol (NORMODYNE) 100 MG tablet Take 1 tablet (100 mg total) by mouth 2 (two) times daily. Patient taking differently: Take 50 mg by mouth 2 (two) times daily.  05/12/14  Yes Shari Upstill, PA-C  methocarbamol (ROBAXIN) 500 MG tablet Take 500 mg by mouth every 8 (eight) hours as needed for muscle spasms.   Yes Historical Provider, MD  cephALEXin (KEFLEX) 500 MG capsule Take 1 capsule (500 mg total) by mouth 4 (four) times daily. Patient not taking: Reported on 05/12/2014 08/15/13   Sunnie NielsenBrian Opitz, MD  diazepam (VALIUM) 5 MG tablet Take 0.5 tablets (2.5 mg total) by mouth 2 (two) times daily. 04/21/15   Melene Planan Floyd, DO  ondansetron (ZOFRAN) 4 MG tablet Take 1 tablet (4 mg total) by mouth every 6 (six) hours. 07/20/15   Rolm GalaStevi Barrett, PA-C  oxymetazoline (AFRIN NASAL SPRAY) 0.05 % nasal spray Place 1 spray into both nostrils 2 (two) times daily. Patient not taking: Reported on 05/12/2014 08/15/13   Sunnie NielsenBrian Opitz, MD    Family History No family history on file.  Social History Social History  Substance Use Topics  . Smoking status: Never Smoker  . Smokeless tobacco: Never Used  . Alcohol use No     Allergies   Review of patient's allergies indicates no  known allergies.   Review of Systems Review of Systems  All other systems reviewed and are negative.    Physical Exam Updated Vital Signs BP 109/67 (BP Location: Left Arm)   Pulse 94   Temp 102 F (38.9 C) (Rectal)   Resp 18   LMP 01/12/2016   SpO2 98%   Physical Exam  Constitutional: She is oriented to person, place, and time. She appears well-developed and well-nourished. No distress.  HENT:  Head: Normocephalic and atraumatic.  Mouth/Throat: No oropharyngeal exudate.  Eyes: Conjunctivae and EOM are normal. Pupils are equal, round, and reactive to light. Right eye exhibits no discharge. Left  eye exhibits no discharge. No scleral icterus.  Cardiovascular: Normal rate, regular rhythm, normal heart sounds and intact distal pulses.  Exam reveals no gallop and no friction rub.   No murmur heard. Pulmonary/Chest: Effort normal and breath sounds normal. No respiratory distress. She has no wheezes. She has no rales. She exhibits no tenderness.  Abdominal: Soft. She exhibits no distension. There is tenderness (diffuse). There is no guarding.  No peritoneal signs  Musculoskeletal: Normal range of motion. She exhibits no edema.  Neurological: She is alert and oriented to person, place, and time.  Skin: Skin is warm and dry. No rash noted. She is not diaphoretic. No erythema. No pallor.  Psychiatric: She has a normal mood and affect. Her behavior is normal.  Nursing note and vitals reviewed.    ED Treatments / Results  Labs (all labs ordered are listed, but only abnormal results are displayed) Labs Reviewed  COMPREHENSIVE METABOLIC PANEL - Abnormal; Notable for the following:       Result Value   Potassium 2.8 (*)    Glucose, Bld 117 (*)    Creatinine, Ser 1.09 (*)    Total Bilirubin 1.4 (*)    All other components within normal limits  CBC - Abnormal; Notable for the following:    HCT 35.9 (*)    MCV 76.4 (*)    Platelets 149 (*)    All other components within normal limits  LIPASE, BLOOD  URINALYSIS, ROUTINE W REFLEX MICROSCOPIC (NOT AT [facility])  MAGNESIUM  DIFFERENTIAL  CBC WITH DIFFERENTIAL/PLATELET  I-STAT CG4 LACTIC ACID, ED    EKG  EKG Interpretation None       Radiology No results found.  Procedures Procedures (including critical care time)  Medications Ordered in ED Medications  sodium chloride 0.9 % bolus 1,000 mL (1,000 mLs Intravenous New Bag/Given 01/16/16 2331)  potassium chloride SA (K-DUR,KLOR-CON) CR tablet 40 mEq (40 mEq Oral Given 01/16/16 2333)  potassium chloride 10 mEq in 100 mL IVPB (10 mEq Intravenous New Bag/Given 01/16/16 2339)  ondansetron  (ZOFRAN) injection 4 mg (4 mg Intravenous Given 01/16/16 2332)  ketorolac (TORADOL) 30 MG/ML injection 30 mg (30 mg Intravenous Given 01/16/16 2333)  acetaminophen (TYLENOL) tablet 650 mg (650 mg Oral Given 01/17/16 0022)     Initial Impression / Assessment and Plan / ED Course  I have reviewed the triage vital signs and the nursing notes.  Pertinent labs & imaging results that were available during my care of the patient were reviewed by me and considered in my medical decision making (see chart for details).  Clinical Course    40 year old female presents to the ED today complaining of vomiting, diarrhea and fevers onset 1 week ago. Patient also has diffuse abdominal tenderness that she describes as cramping. Abdomen is soft, no rigidity. Presentation to ED patient is febrile to 102. Mild  tachycardia at 110 bpm. She does not appear to be toxic or septic. No episodes of emesis while in the ED. Patient was given IV fluids, Toradol and Zofran with significant symptomatic relief. She was also given a dose of acetaminophen for fever. Potassium is low at 2.8. Repleted in ED. Magnesium within normal limits. Heart rate coming down with fluids. Lactate is within normal limits. No leukocytosis. No evidence of sepsis. We'll obtain right upper quadrant ultrasound to rule out cholecystitis a source of fever and symptoms present for 1 week. Patient signed out to [facility] PA-C pending [place]. If negative and pt remains symptomatically improved, may dispo with rx for Zofran. If [place] abnormal, treat accordingly.   Patient was discussed with and seen by Dr. Eudelia Bunch who agrees with the treatment plan.    Final Clinical Impressions(s) / ED Diagnoses   Final diagnoses:  Fever    New Prescriptions New Prescriptions   No medications on file     Dub Mikes, PA-C 01/17/16 1610

## 2016-01-17 NOTE — ED Provider Notes (Signed)
Medical screening examination/treatment/procedure(s) were conducted as a shared visit with non-physician practitioner(s) and myself.  I personally evaluated the patient during the encounter. Briefly, the patient is a 40 y.o. female with history of hypertension presents to the ED with 1 week of nausea vomiting and subjective fevers. Patient noted to be febrile in the ED, otherwise patient's stable. Patient with abdominal discomfort however no evidence of peritonitis. Labs reassuring. Right upper quadrants ultrasound negative for acute cholecystitis. Etiology of patient's symptoms are undetermined at this time however could be secondary to viral process. Patient reports having a primary care provider and being able to follow up closely in 1-2 days. Given the fact that the patient's is well-appearing and nontoxic we feel that she is appropriate for discharge with close follow-up.   EKG Interpretation None           Nira ConnPedro Eduardo Cardama, MD 01/17/16 0300

## 2016-01-17 NOTE — ED Provider Notes (Signed)
1 AM: [facility]. Please see her note for further H and P as well as MDM. Briefly, patient presents with nausea, vomiting, diarrhea, and generalized abdominal pain for the past week. On arrival, she was febrile with a temperature of 102. She was hypokalemic, this was repleted in the ED. Tachycardia and fever improved with fluids and antipyretics. She does not meet sepsis criteria. Right upper quadrant ultrasound pending. Anticipate discharge home with Zofran and close PCP follow-up.  Evaluation does not show pathology requiring ongoing emergent intervention or admission. Pt is hemodynamically stable and mentating appropriately. Discussed findings/results and plan with patient/guardian, who agrees with plan. All questions answered. Return precautions discussed and outpatient follow up given.     Cheri FowlerKayla Dhani Dannemiller, PA-C 01/17/16 0354    April Palumbo, MD 01/17/16 [phone]

## 2017-02-16 ENCOUNTER — Encounter (HOSPITAL_COMMUNITY): Payer: Self-pay | Admitting: Nurse Practitioner

## 2017-02-16 ENCOUNTER — Emergency Department (HOSPITAL_COMMUNITY)
Admission: EM | Admit: 2017-02-16 | Discharge: 2017-02-16 | Disposition: A | Discharge status: Home / Self Care | Payer: Self-pay | Attending: Emergency Medicine | Admitting: Emergency Medicine

## 2017-02-16 ENCOUNTER — Emergency Department (HOSPITAL_COMMUNITY): Payer: Self-pay

## 2017-02-16 DIAGNOSIS — Z79899 Other long term (current) drug therapy: Secondary | ICD-10-CM | POA: Insufficient documentation

## 2017-02-16 DIAGNOSIS — R1030 Lower abdominal pain, unspecified: Secondary | ICD-10-CM | POA: Insufficient documentation

## 2017-02-16 DIAGNOSIS — I10 Essential (primary) hypertension: Secondary | ICD-10-CM | POA: Insufficient documentation

## 2017-02-16 DIAGNOSIS — B349 Viral infection, unspecified: Secondary | ICD-10-CM | POA: Insufficient documentation

## 2017-02-16 LAB — CBC WITH DIFFERENTIAL/PLATELET
Basophils Absolute: 0 10*3/uL (ref 0.0–0.1)
Basophils Relative: 0 %
EOS ABS: 0 10*3/uL (ref 0.0–0.7)
EOS PCT: 0 %
HCT: 43.2 % (ref 36.0–46.0)
HEMOGLOBIN: 14.8 g/dL (ref 12.0–15.0)
LYMPHS ABS: 0.7 10*3/uL (ref 0.7–4.0)
Lymphocytes Relative: 9 %
MCH: 26.1 pg (ref 26.0–34.0)
MCHC: 34.3 g/dL (ref 30.0–36.0)
MCV: 76.1 fL — ABNORMAL LOW (ref 78.0–100.0)
MONO ABS: 0.4 10*3/uL (ref 0.1–1.0)
MONOS PCT: 5 %
Neutro Abs: 7.1 10*3/uL (ref 1.7–7.7)
Neutrophils Relative %: 86 %
Platelets: 155 10*3/uL (ref 150–400)
RBC: 5.68 MIL/uL — ABNORMAL HIGH (ref 3.87–5.11)
RDW: 14.6 % (ref 11.5–15.5)
WBC: 8.2 10*3/uL (ref 4.0–10.5)

## 2017-02-16 LAB — COMPREHENSIVE METABOLIC PANEL
ALT: 27 U/L (ref 14–54)
AST: 27 U/L (ref 15–41)
Albumin: 4.4 g/dL (ref 3.5–5.0)
Alkaline Phosphatase: 56 U/L (ref 38–126)
Anion gap: 10 (ref 5–15)
BUN: 9 mg/dL (ref 6–20)
CHLORIDE: 100 mmol/L — AB (ref 101–111)
CO2: 25 mmol/L (ref 22–32)
Calcium: 8.9 mg/dL (ref 8.9–10.3)
Creatinine, Ser: 1.03 mg/dL — ABNORMAL HIGH (ref 0.44–1.00)
GFR calc Af Amer: >60 mL/min (ref >60)
GFR calc non Af Amer: >60 mL/min (ref >60)
GLUCOSE: 119 mg/dL — AB (ref 65–99)
Potassium: 3.2 mmol/L — ABNORMAL LOW (ref 3.5–5.1)
SODIUM: 135 mmol/L (ref 135–145)
Total Bilirubin: 2.2 mg/dL — ABNORMAL HIGH (ref 0.3–1.2)
Total Protein: 7.8 g/dL (ref 6.5–8.1)

## 2017-02-16 LAB — INFLUENZA PANEL BY PCR (TYPE A & B)
INFLBPCR: NEGATIVE
Influenza A By PCR: NEGATIVE

## 2017-02-16 LAB — URINALYSIS, ROUTINE W REFLEX MICROSCOPIC
Bilirubin Urine: NEGATIVE
GLUCOSE, UA: NEGATIVE mg/dL
Hgb urine dipstick: NEGATIVE
KETONES UR: 5 mg/dL — AB
Leukocytes, UA: NEGATIVE
Nitrite: NEGATIVE
PH: 5 (ref 5.0–8.0)
Protein, ur: 30 mg/dL — AB
Specific Gravity, Urine: 1.021 (ref 1.005–1.030)

## 2017-02-16 LAB — WET PREP, GENITAL
Sperm: NONE SEEN
Trich, Wet Prep: NONE SEEN
WBC, Wet Prep HPF POC: NONE SEEN
Yeast Wet Prep HPF POC: NONE SEEN

## 2017-02-16 LAB — PROTIME-INR
INR: 1.06
Prothrombin Time: 13.8 seconds (ref 11.4–15.2)

## 2017-02-16 LAB — CG4 I-STAT (LACTIC ACID): LACTIC ACID, VENOUS: 1.02 mmol/L (ref 0.5–1.9)

## 2017-02-16 MED ORDER — ACETAMINOPHEN 325 MG PO TABS
650.0000 mg | ORAL_TABLET | Freq: Once | ORAL | Status: AC
  Administered 2017-02-16: 650 mg via ORAL
  Filled 2017-02-16: qty 2

## 2017-02-16 MED ORDER — SODIUM CHLORIDE 0.9 % IV SOLN: Freq: Once | INTRAVENOUS | Status: DC

## 2017-02-16 MED ORDER — ACETAMINOPHEN ER 650 MG PO TBCR: 650.0000 mg | EXTENDED_RELEASE_TABLET | Freq: Three times a day (TID) | ORAL | 0 refills | Status: AC | PRN

## 2017-02-16 MED ORDER — IBUPROFEN 600 MG PO TABS: 600.0000 mg | ORAL_TABLET | Freq: Four times a day (QID) | ORAL | 0 refills | Status: AC | PRN

## 2017-02-16 NOTE — ED Notes (Signed)
Assisted Dr. Rhunette Croft in the Pelvic exam.

## 2017-02-16 NOTE — Discharge Instructions (Signed)
All the results in the ER are normal, labs and imaging. We are not sure what is causing your symptoms. The workup in the ER is not complete, and is limited to screening for life threatening and emergent conditions only, so please see a primary care doctor for further evaluation on Monday. If you get worse or the fever lasts for 1 week - please see the infectious disease doctor.

## 2017-02-16 NOTE — ED Triage Notes (Signed)
Pt is c/o of a subjective fever, back pain that she is localizing at the CVA aspect of her back, states she saw her PCP for hypertension related problems but for the last 3 days she has also been having lower abdominal pain and urinary issues.

## 2017-02-16 NOTE — ED Provider Notes (Signed)
[facility]-EMERGENCY DEPT Provider Note   CSN: [id number] Arrival date & time: 02/16/17  1016     History   Chief Complaint Chief Complaint  Patient presents with  . Fever  . Generalized Body Aches    HPI Courtney Mays is a 41 y.o. female.  HPI  Pt comes in with cc of weakness, fevers. Pt has hx of HTN. She reports that for the past 4 days she has been having fevers, chills, body aches, malaise, headaches, neck pain, back pain, lower abd pain. Pt also has nausea but no emesis and she denies cough, chest pains, shortness of breath. Patient has no pain with urination, blood in the urine, or frequent urination. Pt also denies any vaginal discharge or bleeding and has no risk factors for STD. Pt has no sick contacts. She did return from [place], [place] 2 weeks ago. She denies any sick contacts out in [place] or any infection that is pandemic in the area.     Past Medical History:  Diagnosis Date  . Hypertension     There are no active problems to display for this patient.   History reviewed. No pertinent surgical history.  OB History    No data available       Home Medications    Prior to Admission medications   Medication Sig Start Date End Date Taking? Authorizing Provider  hydrochlorothiazide (HYDRODIURIL) 25 MG tablet Take 1 tablet (25 mg total) by mouth daily. 05/12/14  Yes Upstill, Melvenia Beam, PA-C  labetalol (NORMODYNE) 100 MG tablet Take 1 tablet (100 mg total) by mouth 2 (two) times daily. Patient taking differently: Take 50 mg by mouth 2 (two) times daily.  05/12/14  Yes Elpidio Anis, PA-C  MAGNESIUM PO Take 1 tablet by mouth daily.   Yes [provider]  POTASSIUM PO Take 1 tablet by mouth daily.   Yes [provider]  acetaminophen (TYLENOL 8 HOUR) 650 MG CR tablet Take 1 tablet (650 mg total) by mouth every 8 (eight) hours as needed for pain. 02/16/17   Derwood Kaplan, MD  albuterol (PROVENTIL HFA) 108 (90 BASE) MCG/ACT inhaler  Inhale 1 puff into the lungs every 6 (six) hours as needed for wheezing or shortness of breath.    [provider]  bacitracin ointment Apply 1 application topically 2 (two) times daily. Patient not taking: Reported on 02/16/2017 01/17/16   Cheri Fowler, PA-C  cephALEXin (KEFLEX) 500 MG capsule Take 1 capsule (500 mg total) by mouth 4 (four) times daily. Patient not taking: Reported on 05/12/2014 08/15/13   Sunnie Nielsen, MD  diazepam (VALIUM) 5 MG tablet Take 0.5 tablets (2.5 mg total) by mouth 2 (two) times daily. Patient not taking: Reported on 02/16/2017 04/21/15   Melene Plan, DO  diphenhydrAMINE (BENADRYL) 25 mg capsule Take 25 mg by mouth every 6 (six) hours as needed for allergies.    [provider]  ibuprofen (ADVIL,MOTRIN) 600 MG tablet Take 1 tablet (600 mg total) by mouth every 6 (six) hours as needed. 02/16/17   Derwood Kaplan, MD  ondansetron (ZOFRAN) 4 MG tablet Take 1 tablet (4 mg total) by mouth every 6 (six) hours. Patient not taking: Reported on 02/16/2017 01/17/16   Cheri Fowler, PA-C  oxymetazoline (AFRIN NASAL SPRAY) 0.05 % nasal spray Place 1 spray into both nostrils 2 (two) times daily. Patient not taking: Reported on 05/12/2014 08/15/13   Sunnie Nielsen, MD    Family History History reviewed. No pertinent family history.  Social History  Social History  Substance Use Topics  . Smoking status: Never Smoker  . Smokeless tobacco: Never Used  . Alcohol use No     Allergies   Patient has no known allergies.   Review of Systems Review of Systems  Constitutional: Positive for activity change.  Gastrointestinal: Positive for nausea.  Musculoskeletal: Positive for neck pain. Negative for neck stiffness.  Allergic/Immunologic: Negative for immunocompromised state.  Neurological: Positive for headaches.  All other systems reviewed and are negative.    Physical Exam Updated Vital Signs BP 120/70 (BP Location: Right Arm)   Pulse 100   Temp 99.9 F (37.7  C) (Oral)   Resp 18   LMP 01/23/2017   SpO2 100%   Physical Exam  Constitutional: She is oriented to person, place, and time. She appears well-developed.  HENT:  Head: Normocephalic and atraumatic.  Eyes: EOM are normal.  Neck: Normal range of motion. Neck supple.  No nuchal rigidity  Cardiovascular: Normal rate.   Pulmonary/Chest: Effort normal.  Abdominal: Bowel sounds are normal. There is no tenderness.  Neurological: She is alert and oriented to person, place, and time.  Skin: Skin is warm and dry.  Nursing note and vitals reviewed.    ED Treatments / Results  Labs (all labs ordered are listed, but only abnormal results are displayed) Labs Reviewed  WET PREP, GENITAL - Abnormal; Notable for the following:       Result Value   Clue Cells Wet Prep HPF POC PRESENT (*)    All other components within normal limits  COMPREHENSIVE METABOLIC PANEL - Abnormal; Notable for the following:    Potassium 3.2 (*)    Chloride 100 (*)    Glucose, Bld 119 (*)    Creatinine, Ser 1.03 (*)    Total Bilirubin 2.2 (*)    All other components within normal limits  CBC WITH DIFFERENTIAL/PLATELET - Abnormal; Notable for the following:    RBC 5.68 (*)    MCV 76.1 (*)    All other components within normal limits  URINALYSIS, ROUTINE W REFLEX MICROSCOPIC - Abnormal; Notable for the following:    Color, Urine AMBER (*)    APPearance HAZY (*)    Ketones, ur 5 (*)    Protein, ur 30 (*)    All other components within normal limits  CULTURE, BLOOD (ROUTINE X 2)  CULTURE, BLOOD (ROUTINE X 2)  PROTIME-INR  INFLUENZA PANEL BY PCR (TYPE A & B)  I-STAT CG4 LACTIC ACID, ED  CG4 I-STAT (LACTIC ACID)  GC/CHLAMYDIA PROBE AMP ([place]) NOT AT [facility]    EKG  EKG Interpretation None       Radiology Dg Chest 2 View  Result Date: 02/16/2017 CLINICAL DATA:  Fever EXAM: CHEST  2 VIEW COMPARISON:  December 07, 2011 FINDINGS: Lungs are clear. Heart size and pulmonary vascularity are normal. No  adenopathy. No bone lesions. IMPRESSION: No edema or consolidation. Electronically Signed   By: Bretta Bang III M.D.   On: 02/16/2017 15:00    Procedures Procedures (including critical care time)  Medications Ordered in ED Medications  acetaminophen (TYLENOL) tablet 650 mg (650 mg Oral Given 02/16/17 1812)     Initial Impression / Assessment and Plan / ED Course  I have reviewed the triage vital signs and the nursing notes.  Pertinent labs & imaging results that were available during my care of the patient were reviewed by me and considered in my medical decision making (see chart for details).  Clinical Course as  of Feb 16 2002  Fri Feb 16, 2017  1838 Pt reassessed. Pt's VSS and WNL. Pt's cap refill < 3 seconds. Pt has been hydrated in the ER and now passed po challenge. We will discharge with antiemetic. Strict ER return precautions have been discussed and pt will return if he is unable to tolerate fluids and symptoms are getting worse.   [AN]    Clinical Course User Index [AN] Derwood Kaplan, MD    Pt comes in with what appears to be viral infection.  However, she did travel back from [place] just within the last 2 weeks. DDX: Viral infection [place] Dengue fever  Pt's labs are grossly unremarkable except for elevated Bili. Pt has lower abd pain, pelvic exam is benign. Pt's fever responded to meds here. Pt has passed po challenge. I spoke with Dr. Luciana Axe, ID and we discussed the presentation, the labs and travel to [place]. He recommends pcp f/u and return to the ER if the symptoms are getting worse or ID f/u if not getting better. Results from the ER workup discussed with the patient face to face and all questions answered to the best of my ability.   Final Clinical Impressions(s) / ED Diagnoses   Final diagnoses:  Viral syndrome  Hyperbilirubinemia    New Prescriptions New Prescriptions   ACETAMINOPHEN (TYLENOL 8 HOUR) 650 MG CR TABLET     Take 1 tablet (650 mg total) by mouth every 8 (eight) hours as needed for pain.   IBUPROFEN (ADVIL,MOTRIN) 600 MG TABLET    Take 1 tablet (600 mg total) by mouth every 6 (six) hours as needed.     Derwood Kaplan, MD 02/16/17 2011

## 2017-02-16 NOTE — ED Notes (Signed)
Pt c/o dizziness and near syncopal episode when walking from stretcher to W/C. EDP notified and order for fluid bolus obtained.

## 2017-02-19 LAB — GC/CHLAMYDIA PROBE AMP (~~LOC~~) NOT AT ARMC
CHLAMYDIA, DNA PROBE: NEGATIVE
NEISSERIA GONORRHEA: NEGATIVE

## 2017-02-21 LAB — CULTURE, BLOOD (ROUTINE X 2)
CULTURE: NO GROWTH
Culture: NO GROWTH
SPECIAL REQUESTS: ADEQUATE
Special Requests: ADEQUATE

## 2017-03-28 IMAGING — CT CT ABD-PELV W/ CM
2 of 4 series · 17 of 46 positions shown, 19 images · IV contrast (omnipaque)
Comparison: 08/15/2013 ultrasound

CLINICAL DATA: Mid to lower abdominal pain with nausea beginning 2
days ago.

EXAM:
CT ABDOMEN AND PELVIS WITH CONTRAST
TECHNIQUE: Multidetector CT imaging of the abdomen and pelvis was performed
using the standard protocol following bolus administration of
intravenous contrast.
CONTRAST:  100mL OMNIPAQUE IOHEXOL 300 MG/ML  SOLN

[Series 2: a/p w/ 5mm · axial · 0.95mm/px · z∈[-472,-12]mm · 14 of 102 slices shown, 16 images]
[im 5/102  soft-tissue]
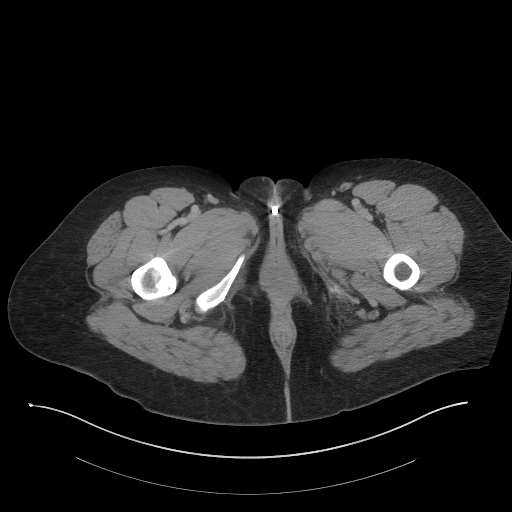
[im 5/102  bone]
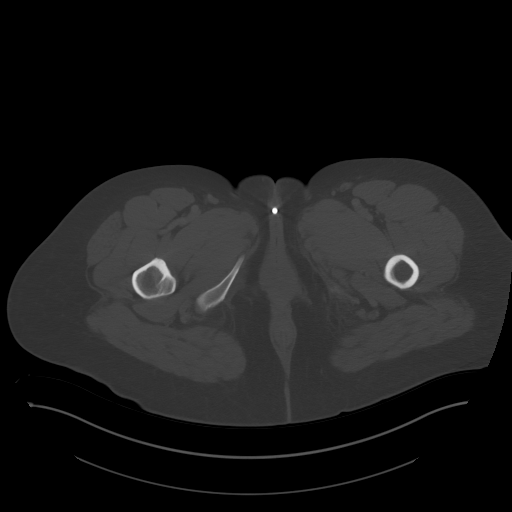
[im 14/102  soft-tissue]
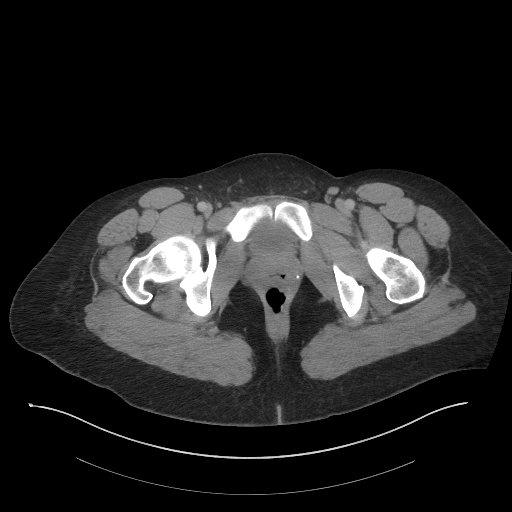
[im 19/102  soft-tissue]
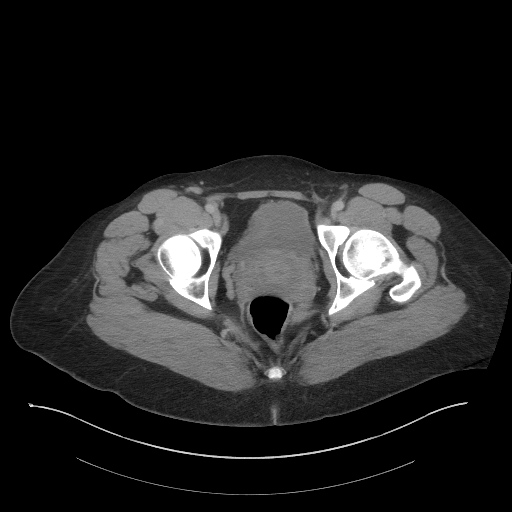
[im 28/102  soft-tissue]
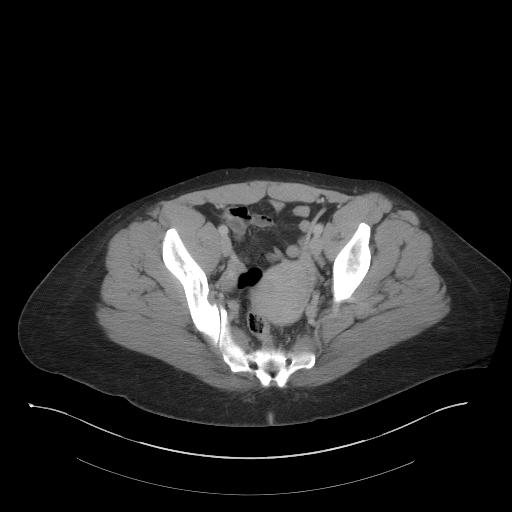
[im 33/102  soft-tissue]
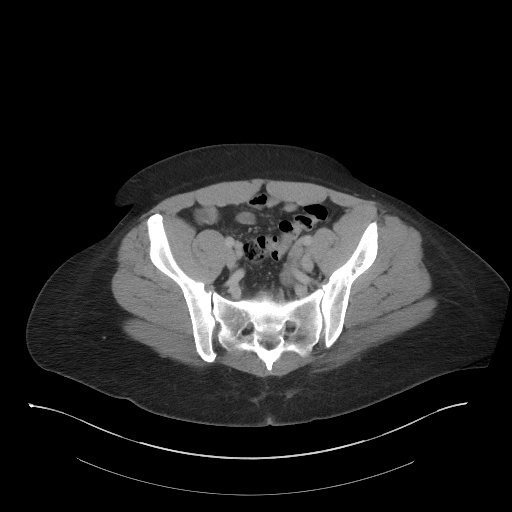
[im 42/102  soft-tissue]
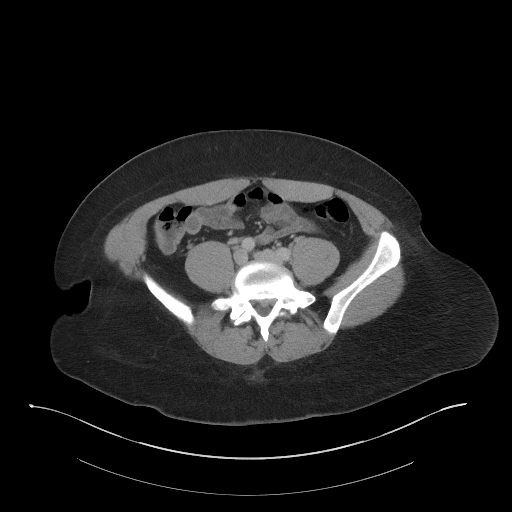
[im 46/102  soft-tissue]
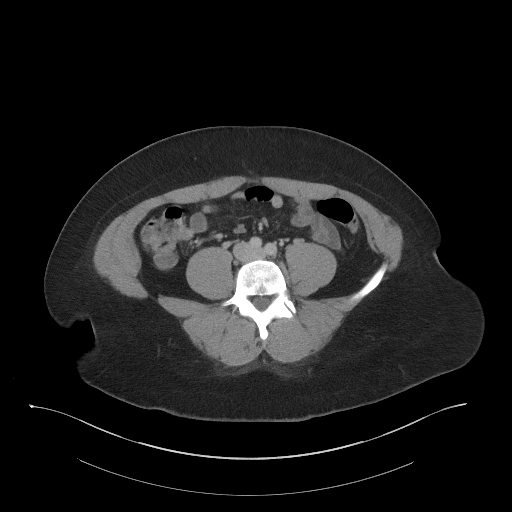
[im 56/102  soft-tissue]
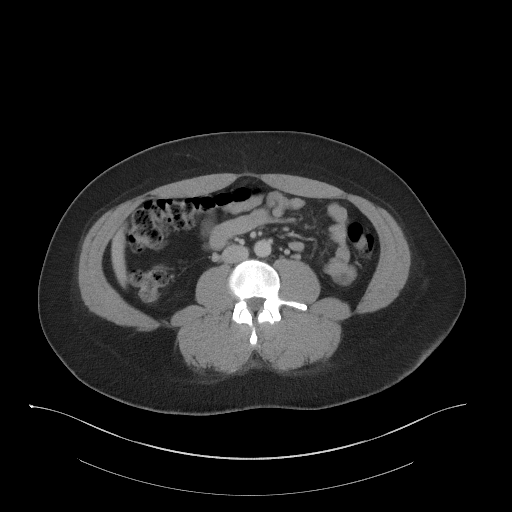
[im 60/102  soft-tissue]
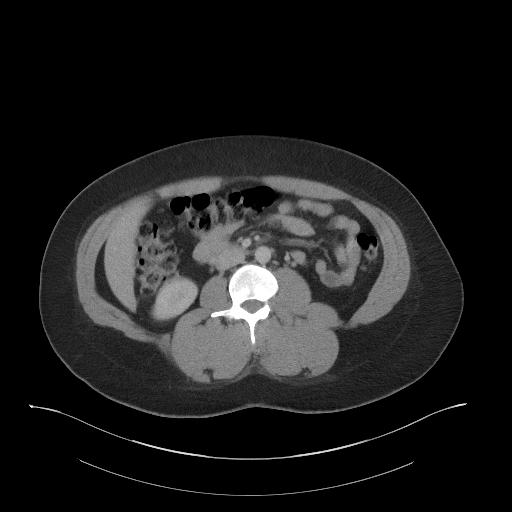
[im 60/102  bone]
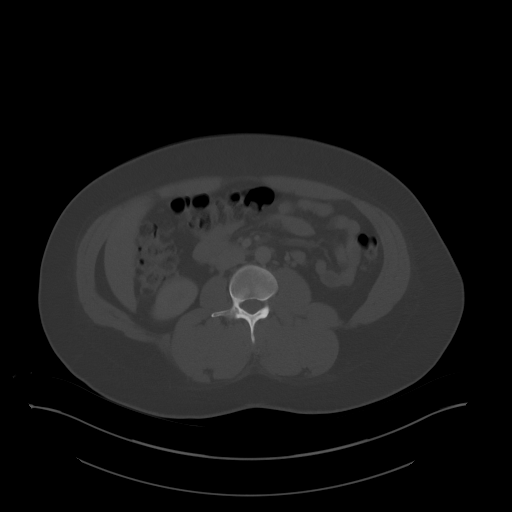
[im 69/102  soft-tissue]
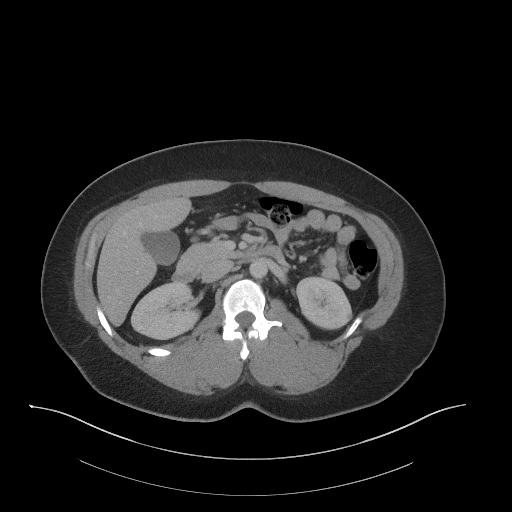
[im 74/102  soft-tissue]
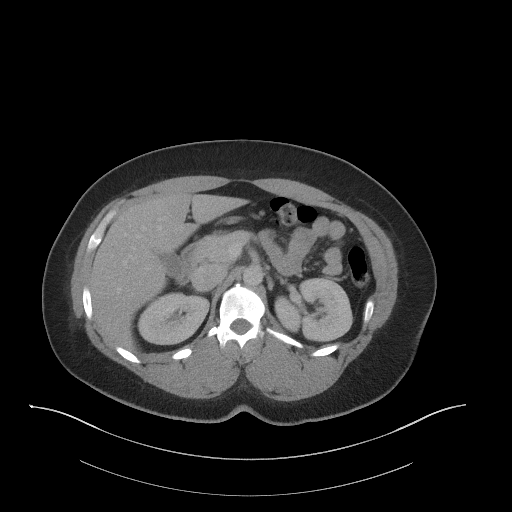
[im 83/102  soft-tissue]
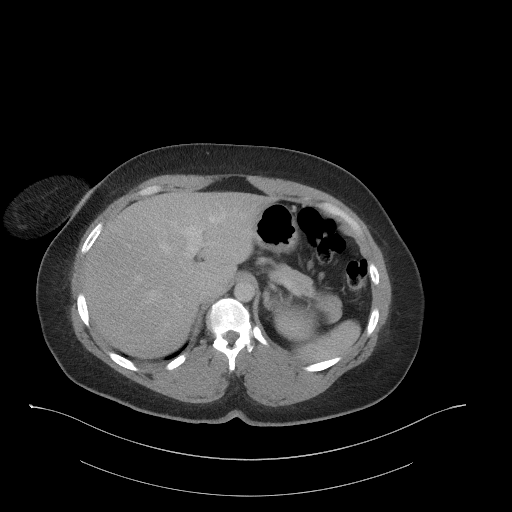
[im 88/102  soft-tissue]
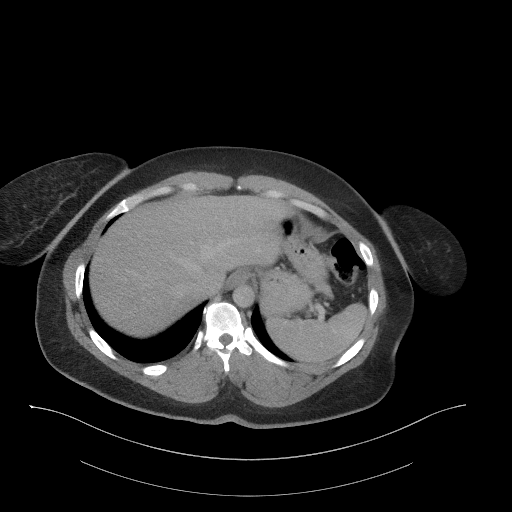
[im 97/102  soft-tissue]
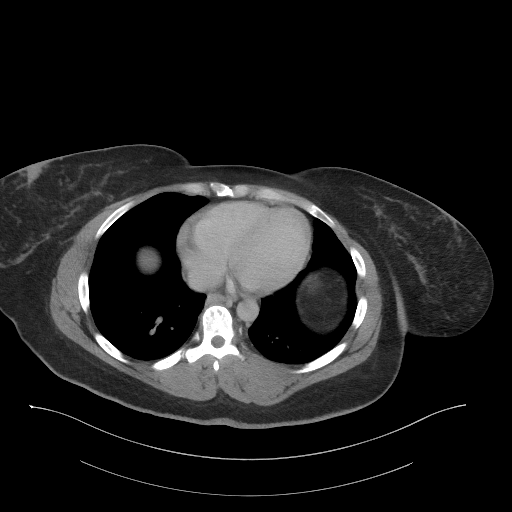

[Series 5: a/p w/ cor · coronal · 0.95mm/px · 3 of 151 slices shown]
[im 51/151  soft-tissue]
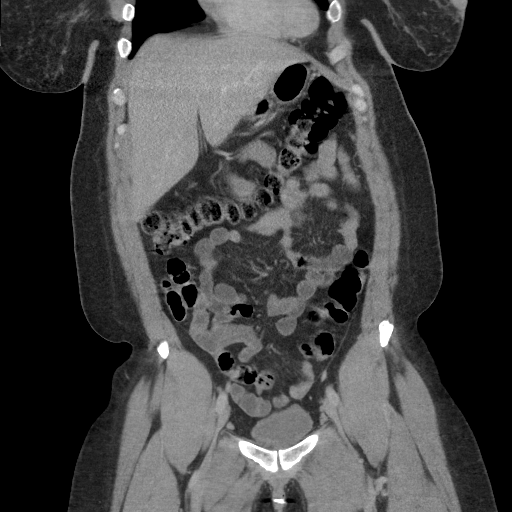
[im 67/151  soft-tissue]
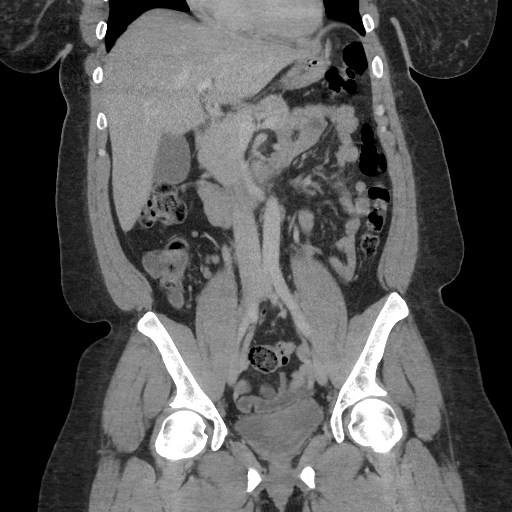
[im 84/151  soft-tissue]
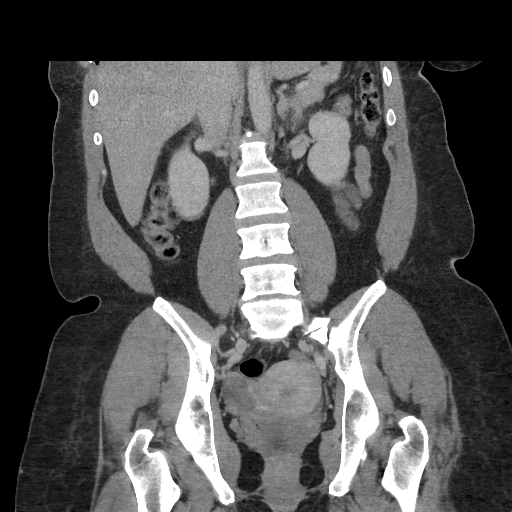

[17 of 46 positions shown; findings below may reference images not displayed]

FINDINGS: Lower chest:  Unremarkable

Hepatobiliary: Unremarkable

Pancreas: Unremarkable

Spleen: Unremarkable

Adrenals/Urinary Tract: Unremarkable

Stomach/Bowel: Unremarkable.  Appendix normal.

Vascular/Lymphatic: Unremarkable

Reproductive: Small but abnormal amount of free pelvic fluid.
Indistinct potential hypodense lesion of the right ovary about 2
point 3 cm diameter. Retroverted uterus.

Other: No supplemental non-categorized findings.

Musculoskeletal: Lumbar spondylosis and degenerative disc disease
causing central narrowing of the thecal sac at L4-5 and bilateral
foraminal stenosis at L3-4 and L4-5. Slightly transitional L5
vertebra. There is a fairly notable central disc protrusion at L4-5
extending caudad.
IMPRESSION: 1. Small but abnormal amount of free pelvic fluid. Hypodense lesion
of the right ovary, consider pelvic sonography for further workup.
2. Lumbar spondylosis and degenerative disc disease causing
impingement at L4-5 and L3-4.

## 2018-09-19 ENCOUNTER — Other Ambulatory Visit: Payer: Self-pay | Admitting: Obstetrics and Gynecology

## 2018-09-19 ENCOUNTER — Other Ambulatory Visit: Payer: Self-pay | Admitting: Nurse Practitioner

## 2018-09-19 DIAGNOSIS — Z1231 Encounter for screening mammogram for malignant neoplasm of breast: Secondary | ICD-10-CM

## 2018-11-18 ENCOUNTER — Ambulatory Visit
Admission: RE | Admit: 2018-11-18 | Discharge: 2018-11-18 | Disposition: A | Discharge status: Home / Self Care | Payer: No Typology Code available for payment source | Source: Ambulatory Visit | Attending: Nurse Practitioner | Admitting: Nurse Practitioner

## 2018-11-18 ENCOUNTER — Other Ambulatory Visit: Payer: Self-pay

## 2018-11-18 DIAGNOSIS — Z1231 Encounter for screening mammogram for malignant neoplasm of breast: Secondary | ICD-10-CM

## 2019-10-10 ENCOUNTER — Other Ambulatory Visit: Payer: Self-pay | Admitting: Family Medicine

## 2019-10-10 ENCOUNTER — Other Ambulatory Visit: Payer: Self-pay

## 2019-10-10 ENCOUNTER — Other Ambulatory Visit: Payer: Self-pay | Admitting: Nurse Practitioner

## 2020-09-16 ENCOUNTER — Other Ambulatory Visit: Payer: Self-pay | Admitting: Nurse Practitioner

## 2020-09-16 DIAGNOSIS — Z1231 Encounter for screening mammogram for malignant neoplasm of breast: Secondary | ICD-10-CM

## 2021-08-09 ENCOUNTER — Other Ambulatory Visit: Payer: Self-pay | Admitting: Obstetrics and Gynecology

## 2021-08-09 DIAGNOSIS — Z1231 Encounter for screening mammogram for malignant neoplasm of breast: Secondary | ICD-10-CM

## 2021-10-12 ENCOUNTER — Other Ambulatory Visit: Payer: Self-pay | Admitting: Nurse Practitioner

## 2021-10-12 DIAGNOSIS — Z1231 Encounter for screening mammogram for malignant neoplasm of breast: Secondary | ICD-10-CM

## 2021-11-07 ENCOUNTER — Ambulatory Visit
Admission: RE | Admit: 2021-11-07 | Discharge: 2021-11-07 | Disposition: A | Discharge status: Home / Self Care | Payer: No Typology Code available for payment source | Source: Ambulatory Visit | Attending: Nurse Practitioner | Admitting: Nurse Practitioner

## 2021-11-07 DIAGNOSIS — Z1231 Encounter for screening mammogram for malignant neoplasm of breast: Secondary | ICD-10-CM

## 2021-11-09 ENCOUNTER — Other Ambulatory Visit: Payer: Self-pay | Admitting: Nurse Practitioner

## 2021-11-09 DIAGNOSIS — R928 Other abnormal and inconclusive findings on diagnostic imaging of breast: Secondary | ICD-10-CM

## 2022-11-28 ENCOUNTER — Other Ambulatory Visit: Payer: Self-pay | Admitting: Family

## 2022-11-28 DIAGNOSIS — Z1231 Encounter for screening mammogram for malignant neoplasm of breast: Secondary | ICD-10-CM

## 2022-11-28 DIAGNOSIS — N6489 Other specified disorders of breast: Secondary | ICD-10-CM

## 2022-12-07 ENCOUNTER — Ambulatory Visit
Admission: RE | Admit: 2022-12-07 | Discharge: 2022-12-07 | Disposition: A | Discharge status: Home / Self Care | Payer: BC Managed Care – PPO | Source: Ambulatory Visit | Attending: Family | Admitting: Family

## 2022-12-07 ENCOUNTER — Ambulatory Visit: Payer: No Typology Code available for payment source

## 2022-12-07 DIAGNOSIS — N6489 Other specified disorders of breast: Secondary | ICD-10-CM

## 2024-03-27 LAB — RESULTS CONSOLE HPV: CHL HPV: NEGATIVE

## 2024-03-27 LAB — HM PAP SMEAR

## 2024-03-28 ENCOUNTER — Other Ambulatory Visit: Payer: Self-pay

## 2024-03-28 DIAGNOSIS — N6489 Other specified disorders of breast: Secondary | ICD-10-CM

## 2024-05-15 ENCOUNTER — Ambulatory Visit: Payer: Self-pay | Admitting: *Deleted

## 2024-05-15 ENCOUNTER — Inpatient Hospital Stay: Admission: RE | Admit: 2024-05-15

## 2024-05-15 VITALS — BP 130/98 | Wt 231.7 lb

## 2024-05-15 DIAGNOSIS — Z1211 Encounter for screening for malignant neoplasm of colon: Secondary | ICD-10-CM

## 2024-05-15 DIAGNOSIS — R87612 Low grade squamous intraepithelial lesion on cytologic smear of cervix (LGSIL): Secondary | ICD-10-CM

## 2024-05-15 DIAGNOSIS — N6489 Other specified disorders of breast: Secondary | ICD-10-CM

## 2024-05-15 DIAGNOSIS — Z1239 Encounter for other screening for malignant neoplasm of breast: Secondary | ICD-10-CM

## 2024-05-15 NOTE — Progress Notes (Signed)
 Ms. Courtney Mays is a 48 y.o. female who presents to [facility] clinic today with no complaints. Patient referred to [facility] due to having a diagnostic mammogram completed 12/07/2022 that was probably benign that a diagnostic mammogram is recommended in one year for follow up.   Pap Smear: Pap smear not completed today. Last Pap smear was 03/27/2024 at [facility] and was abnormal - LSIL encompassing HPV and mild dysplasia that was HPV negative. Per patient has history of two other abnormal Pap smears 11/28/2022 that was LSIL with negative HPV and 07/20/2017 that was ASCUS with negative HPV. Per patient only had only had follow up Pap smears for both abnormal results. Last Pap smear result is available in Epic.   Physical exam: Breasts Breasts symmetrical. No skin abnormalities bilateral breasts. Bilateral nipple inversion that is greater on the left that per patient is normal for her. No nipple discharge bilateral breasts. No lymphadenopathy. No lumps palpated bilateral breasts. Fatty area palpated right axilla that per patient has been evaluated. No complaints of pain or tenderness on exam.     MS 3D DIAG MAMMO BILAT BR (aka MM) Result Date: 05/15/2024 CLINICAL DATA:  48 year old female presents as a delayed recall for a right breast asymmetry. EXAM: DIGITAL DIAGNOSTIC BILATERAL MAMMOGRAM WITH TOMOSYNTHESIS AND CAD TECHNIQUE: Bilateral digital diagnostic mammography and breast tomosynthesis was performed. The images were evaluated with computer-aided detection. Additional 2 and 3D spot compression views were obtained. COMPARISON:  Previous exam(s). ACR Breast Density Category b: There are scattered areas of fibroglandular density. FINDINGS: Right mammogram: The previously identified asymmetry about upper-outer posterior breast is stable and likely represents normal fibroglandular tissue. This completes the BI-RADS 3 surveillance for this finding. No findings suspicious for  malignancy. Left mammogram:  No findings suspicious for malignancy. IMPRESSION: No mammographic evidence of malignancy. RECOMMENDATION: Return to annual screening mammograms. I have discussed the findings and recommendations with the patient. If applicable, a reminder letter will be sent to the patient regarding the next appointment. BI-RADS CATEGORY  2: Benign. Electronically Signed   By: Curtistine Noble   On: 05/15/2024 14:03   MM 3D DIAGNOSTIC MAMMOGRAM BILATERAL BREAST Result Date: 12/07/2022 CLINICAL DATA:  48 year old female presenting as a delayed recall from screening. Patient was recalled from screening mammogram 1 year ago and is now presenting for diagnostic evaluation. EXAM: DIGITAL DIAGNOSTIC BILATERAL MAMMOGRAM WITH TOMOSYNTHESIS AND CAD TECHNIQUE: Bilateral digital diagnostic mammography and breast tomosynthesis was performed. The images were evaluated with computer-aided detection. COMPARISON:  Previous exam(s). ACR Breast Density Category b: There are scattered areas of fibroglandular density. FINDINGS: Right breast: Spot compression tomosynthesis views of the right breast were performed in addition to standard views for a questioned asymmetry in the upper outer right breast. On the additional imaging the asymmetry effaces and likely represents an delaware of normal tissue. There is no suspicious mass or distortion. There are no new suspicious findings elsewhere in the right breast. Left breast: No suspicious mass, distortion, or microcalcifications are identified to suggest presence of malignancy. IMPRESSION: 1. Probably benign asymmetry in the upper outer right breast, favored to represent an delaware of normal tissue, and stable for 1 year. 2. No mammographic evidence of malignancy in the left breast. RECOMMENDATION: Diagnostic bilateral mammogram in 1 year. I have discussed the findings and recommendations with the patient. If applicable, a reminder letter will be sent to the patient regarding  the next appointment. BI-RADS CATEGORY  3: Probably benign. Electronically Signed   By:  Inocente Ast M.D.   On: 12/07/2022 09:52   MS DIGITAL SCREENING TOMO BILATERAL Result Date: 11/08/2021 CLINICAL DATA:  Screening. EXAM: DIGITAL SCREENING BILATERAL MAMMOGRAM WITH TOMOSYNTHESIS AND CAD TECHNIQUE: Bilateral screening digital craniocaudal and mediolateral oblique mammograms were obtained. Bilateral screening digital breast tomosynthesis was performed. The images were evaluated with computer-aided detection. COMPARISON:  Previous exam(s). ACR Breast Density Category b: There are scattered areas of fibroglandular density. FINDINGS: In the right breast, a possible asymmetry warrants further evaluation. In the left breast, no findings suspicious for malignancy. IMPRESSION: Further evaluation is suggested for possible asymmetry in the right breast. RECOMMENDATION: Diagnostic mammogram and possibly ultrasound of the right breast. (Code:[id number]) The patient will be contacted regarding the findings, and additional imaging will be scheduled. BI-RADS CATEGORY  0: Incomplete. Need additional imaging evaluation and/or prior mammograms for comparison. Electronically Signed   By: Almarie Daring M.D.   On: 11/08/2021 14:37    Pelvic/Bimanual Pap is not indicated today per BCCCP guidelines.   Smoking History: Patient has never smoked.   Patient Navigation: Patient education provided. Access to services provided for patient through [facility] program. Patient has food insecurities. Patient escorted to the market at the [facility] for groceries.  Colorectal Cancer Screening: Per patient has had a colonoscopy completed in July 2024. FIT Test given to patient to complete. No complaints today.    Breast and Cervical Cancer Risk Assessment: Patient does not have family history of breast cancer, known genetic mutations, or radiation treatment to the chest before age 67. Patient does not have history  of cervical dysplasia, immunocompromised, or DES exposure in-utero.  Risk Scores as of Encounter on 05/15/2024     Alisa           5-year 0.76%   Lifetime 7.01%            Last calculated by Logan Lyle BRAVO, CMA on 05/15/2024 at 11:18 AM        A: [facility] exam without pap smear No complaints.  P: Referred patient to the [facility] for a diagnostic mammogram per recommendation. Appointment scheduled Thursday, May 15, 2024 at 1240.  Referred patient to the [facility] for a colposcopy to follow up for her abnormal Pap smear. The [facility] will call with appointment.  Driscilla Wanda SQUIBB, RN 05/15/2024 11:29 AM

## 2024-05-15 NOTE — Patient Instructions (Signed)
 Explained breast self awareness with Courtney Mays. Patient did not need a Pap smear today due to last Pap smear was 03/27/2024. Explained the colposcopy the recommended follow up for her abnormal Pap smear. Referred patient to the [facility] for a colposcopy to follow up for her abnormal Pap smear. The [facility] will call with appointment. Referred patient to the [facility] for a diagnostic mammogram per recommendation. Appointment scheduled Thursday, May 15, 2024 at 1240. Patient aware of appointment and will be there. Courtney Mays verbalized understanding.  [name], [name], RN 11:29 AM

## 2024-05-16 ENCOUNTER — Ambulatory Visit: Payer: Self-pay | Admitting: Obstetrics & Gynecology

## 2024-06-05 ENCOUNTER — Other Ambulatory Visit: Payer: Self-pay

## 2024-06-06 ENCOUNTER — Encounter: Payer: Self-pay | Admitting: Obstetrics and Gynecology

## 2024-06-12 LAB — FECAL OCCULT BLOOD, IMMUNOCHEMICAL: Fecal Occult Bld: NEGATIVE

## 2024-06-13 ENCOUNTER — Ambulatory Visit: Payer: Self-pay
